# Patient Record
Sex: Male | Born: 2000 | Race: White | Hispanic: No | Marital: Single | Smoking: Never smoker
Health system: Southern US, Community
[De-identification: ages and names within clinical notes are randomized; demographics above are authoritative.]

## PROBLEM LIST (undated history)

## (undated) DIAGNOSIS — F419 Anxiety disorder, unspecified: Secondary | ICD-10-CM

## (undated) DIAGNOSIS — F32A Depression, unspecified: Secondary | ICD-10-CM

## (undated) DIAGNOSIS — F329 Major depressive disorder, single episode, unspecified: Secondary | ICD-10-CM

## (undated) DIAGNOSIS — S42309A Unspecified fracture of shaft of humerus, unspecified arm, initial encounter for closed fracture: Secondary | ICD-10-CM

## (undated) DIAGNOSIS — S6291XA Unspecified fracture of right wrist and hand, initial encounter for closed fracture: Secondary | ICD-10-CM

## (undated) DIAGNOSIS — K219 Gastro-esophageal reflux disease without esophagitis: Secondary | ICD-10-CM

## (undated) DIAGNOSIS — R519 Headache, unspecified: Secondary | ICD-10-CM

## (undated) HISTORY — PX: OTHER SURGICAL HISTORY: SHX169

## (undated) HISTORY — DX: Depression, unspecified: F32.A

## (undated) HISTORY — DX: Anxiety disorder, unspecified: F41.9

---
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
## 2018-11-06 ENCOUNTER — Emergency Department (HOSPITAL_COMMUNITY)
Admission: EM | Admit: 2018-11-06 | Discharge: 2018-11-06 | Disposition: A | Discharge status: Other Acute Inpatient Hospital | Payer: Medicaid Other | Attending: Emergency Medicine | Admitting: Emergency Medicine

## 2018-11-06 ENCOUNTER — Other Ambulatory Visit: Payer: Self-pay

## 2018-11-06 ENCOUNTER — Encounter (HOSPITAL_COMMUNITY): Payer: Self-pay

## 2018-11-06 ENCOUNTER — Inpatient Hospital Stay (HOSPITAL_COMMUNITY)
Admission: AD | Admit: 2018-11-06 | Discharge: 2018-11-11 | DRG: 885 | Disposition: A | Discharge status: Home / Self Care | Payer: Medicaid Other | Source: Intra-hospital | Attending: Psychiatry | Admitting: Psychiatry

## 2018-11-06 ENCOUNTER — Encounter (HOSPITAL_COMMUNITY): Payer: Self-pay | Admitting: *Deleted

## 2018-11-06 DIAGNOSIS — F1210 Cannabis abuse, uncomplicated: Secondary | ICD-10-CM | POA: Diagnosis present

## 2018-11-06 DIAGNOSIS — F419 Anxiety disorder, unspecified: Secondary | ICD-10-CM | POA: Diagnosis present

## 2018-11-06 DIAGNOSIS — G4700 Insomnia, unspecified: Secondary | ICD-10-CM | POA: Diagnosis present

## 2018-11-06 DIAGNOSIS — Z818 Family history of other mental and behavioral disorders: Secondary | ICD-10-CM | POA: Diagnosis not present

## 2018-11-06 DIAGNOSIS — F1290 Cannabis use, unspecified, uncomplicated: Secondary | ICD-10-CM | POA: Insufficient documentation

## 2018-11-06 DIAGNOSIS — Z79899 Other long term (current) drug therapy: Secondary | ICD-10-CM

## 2018-11-06 DIAGNOSIS — R45851 Suicidal ideations: Secondary | ICD-10-CM | POA: Diagnosis present

## 2018-11-06 DIAGNOSIS — F332 Major depressive disorder, recurrent severe without psychotic features: Principal | ICD-10-CM | POA: Diagnosis present

## 2018-11-06 DIAGNOSIS — F329 Major depressive disorder, single episode, unspecified: Secondary | ICD-10-CM | POA: Insufficient documentation

## 2018-11-06 LAB — COMPREHENSIVE METABOLIC PANEL
ALBUMIN: 4.2 g/dL (ref 3.5–5.0)
ALT: 17 U/L (ref 0–44)
AST: 20 U/L (ref 15–41)
Alkaline Phosphatase: 82 U/L (ref 38–126)
Anion gap: 7 (ref 5–15)
BILIRUBIN TOTAL: 1.4 mg/dL — AB (ref 0.3–1.2)
BUN: 7 mg/dL (ref 6–20)
CO2: 24 mmol/L (ref 22–32)
Calcium: 9.4 mg/dL (ref 8.9–10.3)
Chloride: 108 mmol/L (ref 98–111)
Creatinine, Ser: 1.13 mg/dL (ref 0.61–1.24)
GFR calc Af Amer: >60 mL/min (ref >60)
GFR calc non Af Amer: >60 mL/min (ref >60)
Glucose, Bld: 114 mg/dL — ABNORMAL HIGH (ref 70–99)
Potassium: 4 mmol/L (ref 3.5–5.1)
Sodium: 139 mmol/L (ref 135–145)
Total Protein: 6.1 g/dL — ABNORMAL LOW (ref 6.5–8.1)

## 2018-11-06 LAB — CBC
HCT: 42.9 % (ref 39.0–52.0)
HEMOGLOBIN: 14.7 g/dL (ref 13.0–17.0)
MCH: 31.7 pg (ref 26.0–34.0)
MCHC: 34.3 g/dL (ref 30.0–36.0)
MCV: 92.5 fL (ref 80.0–100.0)
Platelets: 199 10*3/uL (ref 150–400)
RBC: 4.64 MIL/uL (ref 4.22–5.81)
RDW: 11.5 % (ref 11.5–15.5)
WBC: 4.2 10*3/uL (ref 4.0–10.5)
nRBC: 0 % (ref 0.0–0.2)

## 2018-11-06 LAB — SALICYLATE LEVEL: Salicylate Lvl: <7 mg/dL (ref 2.8–30.0)

## 2018-11-06 LAB — ACETAMINOPHEN LEVEL

## 2018-11-06 LAB — ETHANOL: Alcohol, Ethyl (B): <10 mg/dL (ref <10)

## 2018-11-06 MED ORDER — MAGNESIUM HYDROXIDE 400 MG/5ML PO SUSP: 30.0000 mL | Freq: Every day | ORAL | Status: DC | PRN

## 2018-11-06 MED ORDER — NICOTINE POLACRILEX 2 MG MT GUM
2.0000 mg | CHEWING_GUM | OROMUCOSAL | Status: DC | PRN
  Administered 2018-11-06 – 2018-11-11 (×12): 2 mg via ORAL
  Filled 2018-11-06 (×5): qty 1

## 2018-11-06 MED ORDER — HYDROXYZINE HCL 25 MG PO TABS
25.0000 mg | ORAL_TABLET | Freq: Three times a day (TID) | ORAL | Status: DC | PRN
  Administered 2018-11-09 – 2018-11-10 (×2): 25 mg via ORAL
  Filled 2018-11-06: qty 1

## 2018-11-06 MED ORDER — TRAZODONE HCL 50 MG PO TABS
50.0000 mg | ORAL_TABLET | Freq: Every evening | ORAL | Status: DC | PRN
  Administered 2018-11-07 – 2018-11-10 (×4): 50 mg via ORAL
  Filled 2018-11-06: qty 1

## 2018-11-06 MED ORDER — ACETAMINOPHEN 325 MG PO TABS
650.0000 mg | ORAL_TABLET | Freq: Four times a day (QID) | ORAL | Status: DC | PRN
  Administered 2018-11-10: 650 mg via ORAL
  Filled 2018-11-06: qty 2

## 2018-11-06 MED ORDER — ALUM & MAG HYDROXIDE-SIMETH 200-200-20 MG/5ML PO SUSP: 30.0000 mL | ORAL | Status: DC | PRN

## 2018-11-06 NOTE — ED Notes (Signed)
Shirt pants wallet and shoes given to pts brother per pts request. No belongings to log or place in locker.

## 2018-11-06 NOTE — Progress Notes (Signed)
Nhia attended wrap-up group. Pt appears anxious in affect and mood. Pt denies SI/HI/AVH/Pain at this time. Pt was given toiletries and oriented to unit. Pt was minimal/guarded with interaction. No new c/o's. Pt was informed of PRNs. Support provided. Will continue with POC.

## 2018-11-06 NOTE — Tx Team (Signed)
Initial Treatment Plan 11/06/2018 5:57 PM [place] B[facility] SUBA [id number]    PATIENT STRESSORS: Financial difficulties Marital or family conflict   PATIENT STRENGTHS: Motivation for treatment/growth Supportive family/friends   PATIENT IDENTIFIED PROBLEMS: "Mental health"  "coping skills and medication"  Anxiety  Depression  Suicidal thoughts             DISCHARGE CRITERIA:  Ability to meet basic life and health needs Adequate post-discharge living arrangements  PRELIMINARY DISCHARGE PLAN: Outpatient therapy Return to previous living arrangement  PATIENT/FAMILY INVOLVEMENT: This treatment plan has been presented to and reviewed with the patient, Cody Kelley, and/or family member.  The patient and family have been given the opportunity to ask questions and make suggestions.  Clarene Critchley, RN 11/06/2018, 5:57 PM

## 2018-11-06 NOTE — ED Provider Notes (Addendum)
[facility] EMERGENCY DEPARTMENT Provider Note   CSN: [id number] Arrival date & time: 11/06/18  1121    History   Chief Complaint Chief Complaint  Patient presents with  . Suicidal    HPI Cody Kelley is a 18 y.o. male.     18 y.o male with a PMH of GERD presents to the ED with a chief complaint of SI x years. Patient reports he has had depressed symptoms for the past few years, states his symptoms have worsening as he has not been in school has no job and currently plays video games all day.  Patient reports he has no plan but states he would kill himself with a gun, however patient does not own a gun.  He denies any HI, hallucinations visual or auditory.  Patient denies any chest pain, shortness of breath, any medical complaint at this time.     Past Medical History:  Diagnosis Date  . Broken arm     There are no active problems to display for this patient.   History reviewed. No pertinent surgical history.      Home Medications    Prior to Admission medications   Medication Sig Start Date End Date Taking? Authorizing Provider  Omeprazole (PRILOSEC PO) Take by mouth.      [provider]    Family History No family history on file.  Social History Social History   Tobacco Use  . Smoking status: Never Smoker  . Smokeless tobacco: Never Used  Substance Use Topics  . Alcohol use: No  . Drug use: Yes    Types: Marijuana    Comment: daily     Allergies   Patient has no known allergies.   Review of Systems Review of Systems  Constitutional: Negative for chills and fever.  HENT: Negative for ear pain and sore throat.   Eyes: Negative for pain and visual disturbance.  Respiratory: Negative for cough and shortness of breath.   Cardiovascular: Negative for chest pain and palpitations.  Gastrointestinal: Negative for abdominal pain and vomiting.  Genitourinary: Negative for dysuria and hematuria.  Musculoskeletal:  Negative for arthralgias and back pain.  Skin: Negative for color change and rash.  Neurological: Negative for seizures and syncope.  Psychiatric/Behavioral: Positive for suicidal ideas. Negative for hallucinations. The patient is not nervous/anxious.   All other systems reviewed and are negative.    Physical Exam Updated Vital Signs BP (!) 142/79 (BP Location: Right Arm)   Pulse 65   Temp 98.6 F (37 C) (Oral)   Resp 20   SpO2 98%   Physical Exam Vitals signs and nursing note reviewed.  Constitutional:      Appearance: He is well-developed.  HENT:     Head: Normocephalic and atraumatic.  Eyes:     General: No scleral icterus.    Pupils: Pupils are equal, round, and reactive to light.  Neck:     Musculoskeletal: Normal range of motion.  Cardiovascular:     Heart sounds: Normal heart sounds.  Pulmonary:     Effort: Pulmonary effort is normal.     Breath sounds: Normal breath sounds. No wheezing.  Chest:     Chest wall: No tenderness.  Abdominal:     General: Bowel sounds are normal. There is no distension.     Palpations: Abdomen is soft.     Tenderness: There is no abdominal tenderness.  Musculoskeletal:        General: No tenderness or deformity.  Skin:    General: Skin is warm and dry.  Neurological:     Mental Status: He is alert and oriented to person, place, and time.      ED Treatments / Results  Labs (all labs ordered are listed, but only abnormal results are displayed) Labs Reviewed  COMPREHENSIVE METABOLIC PANEL - Abnormal; Notable for the following components:      Result Value   Glucose, Bld 114 (*)    Total Protein 6.1 (*)    Total Bilirubin 1.4 (*)    All other components within normal limits  ACETAMINOPHEN LEVEL - Abnormal; Notable for the following components:   Acetaminophen (Tylenol), Serum <10 (*)    All other components within normal limits  ETHANOL  SALICYLATE LEVEL  CBC  RAPID URINE DRUG SCREEN, HOSP PERFORMED     EKG None  Radiology No results found.  Procedures Procedures (including critical care time)  Medications Ordered in ED Medications - No data to display   Initial Impression / Assessment and Plan / ED Course  I have reviewed the triage vital signs and the nursing notes.  Pertinent labs & imaging results that were available during my care of the patient were reviewed by me and considered in my medical decision making (see chart for details).       Patient with no previous medical history aside from reflux presents to the ED with suicidal ideations which she has had for several years.  He reports situation has worsening as he is currently not employed, not in school and plays video games all day.  He reports feeling worse about himself that he spends all day at home.  He does not have a definite plan but reports if he were to hurt himself it would be with a gun, patient denies owning a gun at this time.  He has not tried any outpatient resources at this time.  Denies any HI, visual or auditory hallucinations.Denies any chest pain, shortness of breath or medical complaint at this time. CMP showed no electrolyte abnormality, LFTs are within normal limits.  CBC showed no leukocytosis, hemoglobin stable.  Salicylate and ethanol levels are reassuring.  UDS is pending.  At this time patient is medically clear for psychiatry evaluation.   1:59 PM spoke to TTS, Cody Kelley currently meets criteria for psychiatric inpatient pending placement.  Final Clinical Impressions(s) / ED Diagnoses   Final diagnoses:  Suicidal ideation    ED Discharge Orders    None       Claude Manges, PA-C 11/06/18 1400    Claude Manges, PA-C 11/06/18 1419    Linwood Dibbles, MD 11/07/18 1133

## 2018-11-06 NOTE — Progress Notes (Signed)
Admission Note: Patient is a 18 year old male admitted to the unit for symptoms of anxiety, depression and suicidal ideation with no plan.  Patient is alert and oriented x 4.  Presents with anxious affect and mood.  States he's here to get help for his racing thoughts and depression.  Admission plan of care reviewed and consent signed.  Skin assessment completed. Skin is dry and intact.  No contraband found.  Patient oriented to the unit, staff and room.  Routine safety checks initiated.  Verbalizes understanding of unit rules and protocol.  Patient is safe on the unit.

## 2018-11-06 NOTE — Progress Notes (Signed)
Per Berneice Heinrich, [facility], pt has been accepted to [facility] bed 406-1. Accepting provider is Reola Calkins, NP. Attending provider is Dr. Jola Babinski, MD. Patient can arrive anytime. Number for report is [phone]. CSW spoke with Neldon Labella, RN regarding disposition.   Vilma Meckel. Algis Greenhouse, MSW, LCSW Clinical Social Work/Disposition Phone: [phone] Fax: [phone]

## 2018-11-06 NOTE — Progress Notes (Signed)
Pt actively participated in wrap-up group this evening.

## 2018-11-06 NOTE — ED Triage Notes (Signed)
Pt reports having suicidal though for "several years". Pt states that recently he has been at home a lot and doesn't feel like he is any use for his family. Pt states that he has a plan but when asked he states I just want to leave this earth. Pt states that he has access to knives. pts brother is at bedside and reports that their parents have been in jail most of their lives and their grandmother cares for them and is getting old. pts mother was recently released from jail and "has nothing to do with him". Pt reported to play video games a lot. pts brother is concerned that he has had bruising to his face but is unsure of how he received them. Pt calm and cooperative upon arrival.

## 2018-11-06 NOTE — BH Assessment (Addendum)
Assessment Note  Cody Kelley is an 18 y.o. male who was brought to [facility] by his maternal Grand Mother.  Patient reports experiencing SI and depressive symptoms for the past 2 years.  Mood "depressed and anxious", affect congruent with mood.  Patient reports current SI with a plan to shot self with a gun.  Patient acknowledged he does not have access to a gun.  Patient denied HI, AVH, and self harming.  Patient reports becoming depressed 2 years ago after Mother became dependent on drugs and Father was incarcerated.  Patient reports he and 4 siblings went to live with maternal Grand Mother.  He also reports dropping out of high school starting 12th grade year and sitting home and playing video games all day.  Patient reports smoking 1 to 1 1/2 grams of Cannabis daily.  He reports Cannabis use helps him calm down when he is angry.  Patient reports experiencing racing thoughts and periodic high anxiety.  He reports having a poor appetite and plays video games all night and sleep during the day.  Patient denied a history of outpatient or inpatient mental health or substance use treatment.  Patient reports wanting help for depression and Cannabis use.   Per Reola Calkins, NP; Patient meets inpatient criteria and placement will be sought.  Patient's disposition provided to [employer]..         Diagnosis: Major Depressive Disorder, recurrent, severe with out psychosis; Cannabis Use Disorder, moderate  Past Medical History:  Past Medical History:  Diagnosis Date  . Broken arm     History reviewed. No pertinent surgical history.  Family History: No family history on file.  Social History:  reports that he has never smoked. He has never used smokeless tobacco. He reports current drug use. Drug: Marijuana. He reports that he does not drink alcohol.  Additional Social History:  Substance #2 Name of Substance 2: Cannabis 2 - Age of First Use: 18 years of age 23 - Amount (size/oz): 1 to 1/2 grams per  use 2 - Frequency: Daily 2 - Duration: Ongoing 2 - Last Use / Amount: 11/06/2018  CIWA: CIWA-Ar BP: (!) 142/79 Pulse Rate: 65 COWS:    Allergies: No Known Allergies  Home Medications: (Not in a hospital admission)   OB/GYN Status:  No LMP for male patient.  General Assessment Data Location of Assessment: [facility] ED TTS Assessment: In system Is this a Tele or Face-to-Face Assessment?: Tele Assessment Is this an Initial Assessment or a Re-assessment for this encounter?: Initial Assessment Patient Accompanied by:: N/A Language Other than English: No What gender do you identify as?: Male Marital status: Single Living Arrangements: Other relatives(Materanl Grand Mother and younger Brother ) Can pt return to current living arrangement?: Yes Admission Status: Voluntary Is patient capable of signing voluntary admission?: Yes Referral Source: Self/Family/Friend Insurance type: Medicaid  Medical Screening Exam [facility] Walk-in ONLY) Medical Exam completed: Yes  Crisis Care Plan Living Arrangements: Other relatives(Materanl Grand Mother and younger Brother ) Name of Psychiatrist: None Name of Therapist: None  Education Status Is patient currently in school?: No  Risk to self with the past 6 months Suicidal Ideation: Yes-Currently Present Has patient been a risk to self within the past 6 months prior to admission? : Yes Suicidal Intent: Yes-Currently Present Has patient had any suicidal intent within the past 6 months prior to admission? : No Is patient at risk for suicide?: Yes Suicidal Plan?: Yes-Currently Present Has patient had any suicidal plan within the past  6 months prior to admission? : No Specify Current Suicidal Plan: Shot self with gun Access to Means: No(Patient denied access to a gun ) What has been your use of drugs/alcohol within the last 12 months?: Cannabis Previous Attempts/Gestures: No How many times?: 0 Triggers for Past Attempts: None known Intentional Self  Injurious Behavior: None Family Suicide History: Unknown Recent stressful life event(s): Other (Comment)(Unemployed and no future plans) Persecutory voices/beliefs?: No Depression: Yes Depression Symptoms: Isolating, Feeling worthless/self pity, Feeling angry/irritable Substance abuse history and/or treatment for substance abuse?: Yes Suicide prevention information given to non-admitted patients: Not applicable  Risk to Others within the past 6 months Homicidal Ideation: No Does patient have any lifetime risk of violence toward others beyond the six months prior to admission? : No Thoughts of Harm to Others: No Current Homicidal Intent: No Current Homicidal Plan: No Access to Homicidal Means: No History of harm to others?: No Assessment of Violence: None Noted Does patient have access to weapons?: No(Patient denied ) Criminal Charges Pending?: No Does patient have a court date: No Is patient on probation?: No  Psychosis Hallucinations: None noted Delusions: None noted  Mental Status Report Appearance/Hygiene: In scrubs Eye Contact: Fair Motor Activity: Restlessness Speech: Logical/coherent Level of Consciousness: Alert Mood: Depressed, Anxious Affect: Anxious, Depressed Anxiety Level: Moderate Thought Processes: Coherent, Relevant Judgement: Impaired Orientation: Person, Place, Time, Situation Obsessive Compulsive Thoughts/Behaviors: None  Cognitive Functioning Concentration: Good Memory: Recent Intact, Remote Intact Is patient IDD: No Insight: Poor Impulse Control: Poor Appetite: Poor Have you had any weight changes? : No Change Sleep: Decreased Total Hours of Sleep: 8(during the day, stays up all night) Vegetative Symptoms: None  ADLScreening [facility] Assessment Services) Patient's cognitive ability adequate to safely complete daily activities?: Yes Patient able to express need for assistance with ADLs?: Yes Independently performs ADLs?: Yes (appropriate for  developmental age)  Prior Inpatient Therapy Prior Inpatient Therapy: No  Prior Outpatient Therapy Prior Outpatient Therapy: No Does patient have an ACCT team?: No Does patient have Intensive In-House Services?  : No Does patient have [facility] services? : No Does patient have P4CC services?: No  ADL Screening (condition at time of admission) Patient's cognitive ability adequate to safely complete daily activities?: Yes Is the patient deaf or have difficulty hearing?: No Does the patient have difficulty concentrating, remembering, or making decisions?: No Patient able to express need for assistance with ADLs?: Yes Does the patient have difficulty dressing or bathing?: No Independently performs ADLs?: Yes (appropriate for developmental age) Does the patient have difficulty walking or climbing stairs?: No Weakness of Legs: None Weakness of Arms/Hands: None  Home Assistive Devices/Equipment Home Assistive Devices/Equipment: None      Values / Beliefs Cultural Requests During Hospitalization: None Spiritual Requests During Hospitalization: None   Advance Directives (For Healthcare) Does Patient Have a Medical Advance Directive?: No Would patient like information on creating a medical advance directive?: No - Patient declined          Disposition:  Disposition Initial Assessment Completed for this Encounter: Yes  On Site Evaluation by:   Reviewed with Physician:    Dey-Johnson,Nyelle Wolfson 11/06/2018 1:50 PM

## 2018-11-06 NOTE — ED Notes (Signed)
Pt wanded by security. 

## 2018-11-07 LAB — RAPID URINE DRUG SCREEN, HOSP PERFORMED
Amphetamines: NOT DETECTED
Barbiturates: NOT DETECTED
Benzodiazepines: NOT DETECTED
Cocaine: NOT DETECTED
Opiates: NOT DETECTED
Tetrahydrocannabinol: POSITIVE — AB

## 2018-11-07 MED ORDER — SERTRALINE HCL 25 MG PO TABS
25.0000 mg | ORAL_TABLET | Freq: Every day | ORAL | Status: DC
  Administered 2018-11-07 – 2018-11-08 (×2): 25 mg via ORAL
  Filled 2018-11-07 (×5): qty 1

## 2018-11-07 NOTE — [facility] Group Notes (Signed)
[facility] Group Notes:  (Nursing)  Date:  11/07/2018  Time:  1:30 PM Type of Therapy:  Nurse Education  Participation Level:  Did Not Attend  [name] 11/07/2018, 3:01 PM
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
## 2018-11-07 NOTE — Progress Notes (Signed)
D. Pt presents with an anxious affect/mood- brightens somewhat during interactions. Pt observed interacting appropriately with others in the milieu. Per pt's self inventory, pt rates his depression, hopelessness and anxiety a 4/4/8, respectively. Pt writes that his most important goal today is "work on being positive and smile more" and writes "I will try to communicate with others" to help him meet that goal.  Pt currently denies SI/HI and AV hallucinations A. Labs and vitals monitored. Pt given and educated on medication. Pt supported emotionally and encouraged to express concerns and ask questions.   R. Pt remains safe with 15 minute checks. Will continue POC.

## 2018-11-07 NOTE — Plan of Care (Signed)
D: Patient grandmother approached this RN to ask about new medications. Patient visited with his grandmother and one other visitor in the dayroom and stayed in the dayroom after they left. Patient is alert, oriented, pleasant, and cooperative. Denies SI, HI, AVH, and verbally contracts for safety. Mood and affect are anxious, patient is fidgety. Patient denies physical symptoms/pain.    A: Medications administered per MD order. Support provided. Patient educated on safety on the unit and medications. Routine safety checks every 15 minutes. Patient stated understanding to tell nurse about any new physical symptoms. Patient understands to tell staff of any needs.     R: No adverse drug reactions noted. Patient verbally contracts for safety. Patient remains safe at this time and will continue to monitor.   Problem: Education: Goal: Knowledge of [facility] Education information/materials will improve Outcome: Progressing   Problem: Medication: Goal: Compliance with prescribed medication regimen will improve Outcome: Progressing   Patient oriented to the unit. Patient is willing to take medications as prescribed. Patient remains safe and will continue to monitor.

## 2018-11-07 NOTE — [facility] Suicide Risk Assessment (Signed)
[facility] Admission Suicide Risk Assessment   Nursing information obtained from:  Patient Demographic factors:  Male Current Mental Status:  Self-harm thoughts Loss Factors:  Financial problems / change in socioeconomic status Historical Factors:  NA Risk Reduction Factors:  Living with another person, especially a relative  Total Time spent with patient: 30 minutes Principal Problem: <principal problem not specified> Diagnosis:  Active Problems:   MDD (major depressive disorder), recurrent severe, without psychosis (HCC)  Subjective Data: Patient is seen and examined.  Patient is a 18 year old male with a past psychiatric history significant for cannabis use disorder who presented to the [facility] emergency department on 11/06/2018 with suicidal ideation.  He was brought to the emergency department by his grandmother.  The patient stated that he had been having suicidal ideation for the last 2 years.  He stated he had felt depressed.  He stated he had a plan to shoot himself with a gun.  The patient and grandmother in the emergency department stated that he had no access to weapons.  The patient stated that he had been depressed since his mother became dependent on drugs and his father was incarcerated.  He apparently has been living with the grandmother since he was in fifth grade.  He has not been seen by a psychiatrist before.  He did receive counseling with something related to issues with his mother and father as an adolescent.  Patient stated that he does not work, and plays video games all day while smoking marijuana.  He did admit to racing thoughts and some high anxiety.  He smiled and engaged throughout the interview.  He denied any previous self-harm in the past.  He admitted to helplessness, hopelessness and worthlessness.  He was admitted to the hospital for evaluation and stabilization.  Continued Clinical Symptoms:  Alcohol Use Disorder Identification Test Final Score  (AUDIT): 0 The "Alcohol Use Disorders Identification Test", Guidelines for Use in Primary Care, Second Edition.  [employer] writer [facility]). Score between 0-7:  no or low risk or alcohol related problems. Score between 8-15:  moderate risk of alcohol related problems. Score between 16-19:  high risk of alcohol related problems. Score 20 or above:  warrants further diagnostic evaluation for alcohol dependence and treatment.   CLINICAL FACTORS:   Depression:   Anhedonia Comorbid alcohol abuse/dependence Hopelessness Impulsivity Insomnia Alcohol/Substance Abuse/Dependencies   Musculoskeletal: Strength & Muscle Tone: within normal limits Gait & Station: normal Patient leans: N/A  Psychiatric Specialty Exam: Physical Exam  Nursing note and vitals reviewed. Constitutional: He is oriented to person, place, and time. He appears well-developed and well-nourished.  HENT:  Head: Normocephalic and atraumatic.  Respiratory: Effort normal.  Neurological: He is alert and oriented to person, place, and time.    ROS  Blood pressure 135/65, pulse 86, temperature 98.9 F (37.2 C), temperature source Oral, resp. rate 18, height 5\' 2"  (1.575 m), weight 56.2 kg, SpO2 98 %.Body mass index is 22.68 kg/m.  General Appearance: Casual  Eye Contact:  Fair  Speech:  Normal Rate  Volume:  Normal  Mood:  Anxious and Depressed  Affect:  Congruent  Thought Process:  Coherent and Descriptions of Associations: Circumstantial  Orientation:  Full (Time, Place, and Person)  Thought Content:  Logical  Suicidal Thoughts:  Yes.  without intent/plan  Homicidal Thoughts:  No  Memory:  Immediate;   Fair Recent;   Fair Remote;   Fair  Judgement:  Impaired  Insight:  Lacking  Psychomotor Activity:  Decreased  Concentration:  Concentration: Fair and Attention Span: Fair  Recall:  [employer] of Knowledge:  Fair  Language:  Fair  Akathisia:  Negative  Handed:  Right  AIMS (if indicated):     Assets:   Desire for Improvement Housing Leisure Time Physical Health Social Support  ADL's:  Intact  Cognition:  WNL  Sleep:  Number of Hours: 5.75      COGNITIVE FEATURES THAT CONTRIBUTE TO RISK:  None    SUICIDE RISK:   Minimal: No identifiable suicidal ideation.  Patients presenting with no risk factors but with morbid ruminations; may be classified as minimal risk based on the severity of the depressive symptoms  PLAN OF CARE: Patient is seen and examined.  Patient is a 18 year old male with the above-stated past psychiatric history who presented to the [facility] behavioral [facility] hospital with suicidal ideation and depression as well as anxiety.  He will be admitted to the hospital.  He will be integrated into the milieu.  He will be encouraged to attend groups.  He will be placed on Zoloft 25 mg p.o. daily.  He will have hydroxyzine available for anxiety, and as well trazodone for sleep.  We will collect collateral information from his grandmother.  Drug screen has not yet been collected, and I will order that this morning.  The rest of his laboratories were reviewed, and are essentially normal.  His blood alcohol was less than 10, salicylate less than 7, acetaminophen less than 10.  I certify that inpatient services furnished can reasonably be expected to improve the patient's condition.   Antonieta Pert, MD 11/07/2018, 8:47 AM

## 2018-11-07 NOTE — Progress Notes (Signed)
Adult Psychoeducational Group Note  Date:  11/07/2018 Time:  8:55 PM  Group Topic/Focus:  Wrap-Up Group:   The focus of this group is to help patients review their daily goal of treatment and discuss progress on daily workbooks.  Participation Level:  Active  Participation Quality:  Appropriate  Affect:  Appropriate  Cognitive:  Alert  Insight: Appropriate  Engagement in Group:  Engaged  Modes of Intervention:  Discussion  Additional Comments:  Patient stated having a pretty good day. Patient's goal for today was to find out what medications he's on. Patient met goal.   Shi Blankenship L Osualdo Hansell 11/07/2018, 8:55 PM

## 2018-11-07 NOTE — [facility] Group Notes (Signed)
[facility] LCSW Group Therapy Note  11/07/2018   10:00-11:00AM  Type of Therapy and Topic:  Group Therapy:  Unhealthy versus Healthy Supports, Which Am I?  Participation Level:  Active   Description of Group:  Patients in this group were introduced to the concept that additional supports including self-support are an essential part of recovery.  Initially a discussion was held about the differences between healthy versus unhealthy supports.  Patients were asked to share what unhealthy supports in their lives need to be addressed, as well as what additional healthy supports could be added for greater help in reaching their goals.   A song entitled "My Own Hero" was played and a group discussion ensued in which patients stated they could relate to the song and it inspired them to realize they have be willing to help themselves in order to succeed, because other people cannot achieve sobriety or stability for them.  We discussed adding a variety of healthy supports to address the various needs in patient lives, including becoming more self-supportive.  Therapeutic Goals: 1)  Highlight the differences between healthy and unhealthy supports 2)  Suggest the importance of being a part of one's own support system 2)  Discuss reasons people in one's life may eventually be unable to be continually supportive  3)  Identify the patient's current support system and   4)  elicit commitments to add healthy supports and to become more conscious of being self-supportive   Summary of Patient Progress:  The patient expressed that the unhealthy support which needs to be addressed includes "doubting myself, because I'm the only person who can guide myself."  Healthy supports which could be added for increased stability and happiness include a therapist, while he already has some level of family and friend support. He was attentive but quiet throughout group.  Therapeutic Modalities:   Motivational  Interviewing Activity  Lynnell Chad

## 2018-11-08 DIAGNOSIS — F332 Major depressive disorder, recurrent severe without psychotic features: Principal | ICD-10-CM

## 2018-11-08 MED ORDER — SERTRALINE HCL 50 MG PO TABS
50.0000 mg | ORAL_TABLET | Freq: Every day | ORAL | Status: DC
  Administered 2018-11-09 – 2018-11-11 (×3): 50 mg via ORAL
  Filled 2018-11-08 (×5): qty 1

## 2018-11-08 NOTE — Progress Notes (Signed)
[facility] MD Progress Note  11/08/2018 3:32 PM Cody Kelley  MRN:  [id number] Subjective: " I am feeling good". Describes improving mood and currently denies any suicidal or self injurious ideations. Denies medication side effects.  Objective: I have discussed case with treatment team and have met with patient. 18 year old male, lives with grandmother,whom he identifies as his closest support. He presented to hospital voluntarily for depression, suicidal ideations, thoughts of shooting himself with a gun. Stressors include not being at school ( which he attributes to anxiety) and states " I have just been playing video games all day". States " I feel kind of stuck". Other stressors include  mother having substance abuse disorder and father being incarcerated.  He describes history of chronic anxiety in addition to depression, and describes symptoms suggestive of social anxiety, with significant anxiety when having to speak in front of others, fearful of being judged negatively. Patient has a history of cannabis abuse-admission UDS was positive for cannabis , admission BAL was negative .  No prior psychiatric admissions. Denies prior history of suicide attempts.   Today patient reports partial improvement compared to admission, states " I am definitely better". Denies suicidal ideations or self injurious ideations. States he has been able to spend time in day room and interact with peers, states " once I get to know people, I feel calmer". Denies medication side effects.  No disruptive behaviors on unit .  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 Broken arm    History reviewed. No  pertinent surgical history. Family History: History reviewed. No pertinent family history. Family Psychiatric  History:  Social History:  Social History   Substance and Sexual Activity  Alcohol Use No     Social History   Substance and Sexual Activity  Drug Use Yes  . Types: Marijuana   Comment: daily    Social History   Socioeconomic History  . Marital status: Single    Spouse name: Not on file  . Number of children: Not on file  . Years of education: Not on file  . Highest education level: Not on file  Occupational History  . Not on file  Social Needs  . Financial resource strain: Not on file  . Food insecurity:    Worry: Not on file    Inability: Not on file  . Transportation needs:    Medical: Not on file    Non-medical: Not on file  Tobacco Use  . Smoking status: Never Smoker  . Smokeless tobacco: Never Used  Substance and Sexual Activity  . Alcohol use: No  . Drug use: Yes    Types: Marijuana    Comment: daily  . Sexual activity: Not on file  Lifestyle  . Physical activity:    Days per week: Not on file    Minutes per session: Not on file  . Stress: Not on file  Relationships  . Social connections:    Talks on phone: Not on file    Gets together: Not on file    Attends religious service: Not on file    Active member of club or organization: Not on file    Attends meetings of clubs or organizations: Not on file  Relationship status: Not on file  Other Topics Concern  . Not on file  Social History Narrative  . Not on file   Additional Social History:   Sleep: improved   Appetite:  Fair  Current Medications: Current Facility-Administered Medications  Medication Dose Route Frequency Provider Last Rate Last Dose  . acetaminophen (TYLENOL) tablet 650 mg  650 mg Oral Q6H PRN Money, Lowry Ram, FNP      . alum & mag hydroxide-simeth (MAALOX/MYLANTA) 200-200-20 MG/5ML suspension 30 mL  30 mL Oral Q4H PRN Money, Lowry Ram, FNP      . hydrOXYzine  (ATARAX/VISTARIL) tablet 25 mg  25 mg Oral TID PRN Money, Lowry Ram, FNP      . magnesium hydroxide (MILK OF MAGNESIA) suspension 30 mL  30 mL Oral Daily PRN Money, Darnelle Maffucci B, FNP      . nicotine polacrilex (NICORETTE) gum 2 mg  2 mg Oral PRN Sharma Covert, MD   2 mg at 11/08/18 1422  . sertraline (ZOLOFT) tablet 25 mg  25 mg Oral Daily Sharma Covert, MD   25 mg at 11/08/18 0818  . traZODone (DESYREL) tablet 50 mg  50 mg Oral QHS PRN Money, Lowry Ram, FNP   50 mg at 11/07/18 2200    Lab Results:  Results for orders placed or performed during the hospital encounter of 11/06/18 (from the past 48 hour(s))  Rapid urine drug screen (hospital performed)     Status: Abnormal   Collection Time: 11/07/18  8:10 AM  Result Value Ref Range   Opiates NONE DETECTED NONE DETECTED   Cocaine NONE DETECTED NONE DETECTED   Benzodiazepines NONE DETECTED NONE DETECTED   Amphetamines NONE DETECTED NONE DETECTED   Tetrahydrocannabinol POSITIVE (A) NONE DETECTED   Barbiturates NONE DETECTED NONE DETECTED    Comment: (NOTE) DRUG SCREEN FOR MEDICAL PURPOSES ONLY.  IF CONFIRMATION IS NEEDED FOR ANY PURPOSE, NOTIFY LAB WITHIN 5 DAYS. LOWEST DETECTABLE LIMITS FOR URINE DRUG SCREEN Drug Class                     Cutoff (ng/mL) Amphetamine and metabolites    1000 Barbiturate and metabolites    200 Benzodiazepine                 614 Tricyclics and metabolites     300 Opiates and metabolites        300 Cocaine and metabolites        300 THC                            50 Performed at [facility], [place] [address] [place], [place] [address]     Blood Alcohol level:  Lab Results  Component Value Date   ETH <10 00/86/7619    Metabolic Disorder Labs: No results found for: HGBA1C, MPG No results found for: PROLACTIN No results found for: CHOL, TRIG, HDL, CHOLHDL, VLDL, LDLCALC  Physical Findings: AIMS: Facial and Oral Movements Muscles of Facial Expression: None, normal Lips  and Perioral Area: None, normal Jaw: None, normal Tongue: None, normal,Extremity Movements Upper (arms, wrists, hands, fingers): None, normal Lower (legs, knees, ankles, toes): None, normal, Trunk Movements Neck, shoulders, hips: None, normal, Overall Severity Severity of abnormal movements (highest score from questions above): None, normal Incapacitation due to abnormal movements: None, normal Patient's awareness of abnormal movements (rate only patient's report): No Awareness, Dental Status Current problems with teeth and/or  dentures?: No Does patient usually wear dentures?: No  CIWA:    COWS:     Musculoskeletal: Strength & Muscle Tone: within normal limits Gait & Station: normal Patient leans: N/A  Psychiatric Specialty Exam: Physical Exam  ROS no headache, no chest pain, no shortness of breath, no vomiting, no fever or chills   Blood pressure 120/75, pulse (!) 52, temperature 98.9 F (37.2 C), temperature source Oral, resp. rate 16, height _0  (1.575 m), weight 56.2 kg, SpO2 98 %.Body mass index is 22.68 kg/m.  General Appearance: Fairly Groomed  Eye Contact:  Fair  Speech:  Normal Rate  Volume:  Normal  Mood:  partially improved, describes mood as 7/10 with 10 being best   Affect:  anxious, vaguely depressed, does smile at times appropriately   Thought Process:  Linear and Descriptions of Associations: Intact  Orientation:  Full (Time, Place, and Person)  Thought Content:  denies hallucinations, no delusions   Suicidal Thoughts:  No denies suicidal or self injurious ideations, denies homicidal or violent ideations   Homicidal Thoughts:  No  Memory:  recent and remote grossly intact   Judgement:  Fair  Insight:  Fair  Psychomotor Activity:  Normal  Concentration:  Concentration: Good and Attention Span: Good  Recall:  Good  Fund of Knowledge:  Good  Language:  Good  Akathisia:  Negative  Handed:  Right  AIMS (if indicated):     Assets:  Desire for  Improvement Resilience  ADL's:  Intact  Cognition:  WNL  Sleep:  Number of Hours: 6.5   Assessment-  18 year old male, lives with grandmother,whom he identifies as his closest support. He presented to hospital voluntarily for depression, suicidal ideations, thoughts of shooting himself with a gun. Stressors include not being at school ( which he attributes to anxiety) and states " I have just been playing video games all day". States " I feel kind of stuck". Other stressors include  mother having substance abuse disorder and father being incarcerated.  He describes history of chronic anxiety in addition to depression, and describes symptoms suggestive of social anxiety, with significant anxiety when having to speak in front of others, fearful of being judged negatively. Patient has a history of cannabis abuse-admission UDS was positive for cannabis , admission BAL was negative .  No prior psychiatric admissions. Denies prior history of suicide attempts.   Currently patient reports feeling partially better than he did on admission. Currently denies suicidal or self injurious ideations, and today presents future oriented, stating he plans to study for/ obtain GED after discharge. Thus far tolerating Zoloft well , denies side effects .   Treatment Plan Summary: Daily contact with patient to assess and evaluate symptoms and progress in treatment, Medication management, Plan inpatient treatment and medications as below Encourage group and milieu participation to work on coping skills and symptom reduction Encourage efforts to work on sobriety/abstinence Treatment team working on disposition planning options Increase Zoloft to 50 mgrs QDAY for depression, anxiety Continue Trazodone 50 mgrs QHS PRN for insomnia Continue Vistaril 25 mgrs TID PRN for anxiety  Jenne Campus, MD 11/08/2018, 3:32 PM

## 2018-11-08 NOTE — Tx Team (Signed)
Interdisciplinary Treatment and Diagnostic Plan Update  11/08/2018 Time of Session: 10:00am Cody Kelley MRN: [id number]  Principal Diagnosis: <principal problem not specified>  Secondary Diagnoses: Active Problems:   MDD (major depressive disorder), recurrent severe, without psychosis (HCC)   Current Medications:  Current Facility-Administered Medications  Medication Dose Route Frequency Provider Last Rate Last Dose  . acetaminophen (TYLENOL) tablet 650 mg  650 mg Oral Q6H PRN Money, Gerlene Burdock, FNP      . alum & mag hydroxide-simeth (MAALOX/MYLANTA) 200-200-20 MG/5ML suspension 30 mL  30 mL Oral Q4H PRN Money, Gerlene Burdock, FNP      . hydrOXYzine (ATARAX/VISTARIL) tablet 25 mg  25 mg Oral TID PRN Money, Gerlene Burdock, FNP      . magnesium hydroxide (MILK OF MAGNESIA) suspension 30 mL  30 mL Oral Daily PRN Money, Feliz Beam B, FNP      . nicotine polacrilex (NICORETTE) gum 2 mg  2 mg Oral PRN Antonieta Pert, MD   2 mg at 11/08/18 1422  . sertraline (ZOLOFT) tablet 25 mg  25 mg Oral Daily Antonieta Pert, MD   25 mg at 11/08/18 0818  . traZODone (DESYREL) tablet 50 mg  50 mg Oral QHS PRN Money, Gerlene Burdock, FNP   50 mg at 11/07/18 2200   PTA Medications: No medications prior to admission.    Patient Stressors: Financial difficulties Marital or family conflict  Patient Strengths: Motivation for treatment/growth Supportive family/friends  Treatment Modalities: Medication Management, Group therapy, Case management,  1 to 1 session with clinician, Psychoeducation, Recreational therapy.   Physician Treatment Plan for Primary Diagnosis: <principal problem not specified> Long Term Goal(s): Improvement in symptoms so as ready for discharge Improvement in symptoms so as ready for discharge   Short Term Goals: Ability to identify changes in lifestyle to reduce recurrence of condition will improve Ability to verbalize feelings will improve Ability to disclose and discuss suicidal  ideas Ability to demonstrate self-control will improve Ability to identify and develop effective coping behaviors will improve Ability to maintain clinical measurements within normal limits will improve Ability to identify triggers associated with substance abuse/mental health issues will improve Ability to identify changes in lifestyle to reduce recurrence of condition will improve Ability to verbalize feelings will improve Ability to disclose and discuss suicidal ideas Ability to demonstrate self-control will improve Ability to identify and develop effective coping behaviors will improve Ability to maintain clinical measurements within normal limits will improve Ability to identify triggers associated with substance abuse/mental health issues will improve  Medication Management: Evaluate patient's response, side effects, and tolerance of medication regimen.  Therapeutic Interventions: 1 to 1 sessions, Unit Group sessions and Medication administration.  Evaluation of Outcomes: Progressing  Physician Treatment Plan for Secondary Diagnosis: Active Problems:   MDD (major depressive disorder), recurrent severe, without psychosis (HCC)  Long Term Goal(s): Improvement in symptoms so as ready for discharge Improvement in symptoms so as ready for discharge   Short Term Goals: Ability to identify changes in lifestyle to reduce recurrence of condition will improve Ability to verbalize feelings will improve Ability to disclose and discuss suicidal ideas Ability to demonstrate self-control will improve Ability to identify and develop effective coping behaviors will improve Ability to maintain clinical measurements within normal limits will improve Ability to identify triggers associated with substance abuse/mental health issues will improve Ability to identify changes in lifestyle to reduce recurrence of condition will improve Ability to verbalize feelings will improve Ability to disclose and  discuss suicidal  ideas Ability to demonstrate self-control will improve Ability to identify and develop effective coping behaviors will improve Ability to maintain clinical measurements within normal limits will improve Ability to identify triggers associated with substance abuse/mental health issues will improve     Medication Management: Evaluate patient's response, side effects, and tolerance of medication regimen.  Therapeutic Interventions: 1 to 1 sessions, Unit Group sessions and Medication administration.  Evaluation of Outcomes: Progressing   RN Treatment Plan for Primary Diagnosis: <principal problem not specified> Long Term Goal(s): Knowledge of disease and therapeutic regimen to maintain health will improve  Short Term Goals: Ability to participate in decision making will improve, Ability to verbalize feelings will improve, Ability to identify and develop effective coping behaviors will improve and Compliance with prescribed medications will improve  Medication Management: RN will administer medications as ordered by provider, will assess and evaluate patient's response and provide education to patient for prescribed medication. RN will report any adverse and/or side effects to prescribing provider.  Therapeutic Interventions: 1 on 1 counseling sessions, Psychoeducation, Medication administration, Evaluate responses to treatment, Monitor vital signs and CBGs as ordered, Perform/monitor CIWA, COWS, AIMS and Fall Risk screenings as ordered, Perform wound care treatments as ordered.  Evaluation of Outcomes: Progressing   LCSW Treatment Plan for Primary Diagnosis: <principal problem not specified> Long Term Goal(s): Safe transition to appropriate next level of care at discharge, Engage patient in therapeutic group addressing interpersonal concerns.  Short Term Goals: Engage patient in aftercare planning with referrals and resources, Increase social support, Increase emotional  regulation and Increase skills for wellness and recovery  Therapeutic Interventions: Assess for all discharge needs, 1 to 1 time with Social worker, Explore available resources and support systems, Assess for adequacy in community support network, Educate family and significant other(s) on suicide prevention, Complete Psychosocial Assessment, Interpersonal group therapy.  Evaluation of Outcomes: Progressing   Progress in Treatment: Attending groups: Yes. Participating in groups: Yes. Taking medication as prescribed: Yes. Toleration medication: Yes. Family/Significant other contact made: No, will contact:  grandmother Patient understands diagnosis: Yes. Discussing patient identified problems/goals with staff: Yes. Medical problems stabilized or resolved: Yes. Denies suicidal/homicidal ideation: Yes. Issues/concerns per patient self-inventory: Yes.  New problem(s) identified: No, Describe:  CSW continuing to assess  New Short Term/Long Term Goal(s): medication management for mood stabilization; elimination of SI thoughts; development of comprehensive mental wellness/sobriety plan.  Patient Goals:  Learn coping skills and manage anxiety  Discharge Plan or Barriers: CSW assessing for appropriate referrals.   Reason for Continuation of Hospitalization: Anxiety Depression  Estimated Length of Stay: 3-5 days  Attendees: Patient: 11/08/2018 2:42 PM  Physician:  11/08/2018 2:42 PM  Nursing:  11/08/2018 2:42 PM  RN Care Manager: 11/08/2018 2:42 PM  Social Worker: Enid Cutter, LCSWA 11/08/2018 2:42 PM  Recreational Therapist:  11/08/2018 2:42 PM  Other:  11/08/2018 2:42 PM  Other:  11/08/2018 2:42 PM  Other: 11/08/2018 2:42 PM    Scribe for Treatment Team: Darreld Mclean, LCSWA 11/08/2018 2:42 PM

## 2018-11-08 NOTE — Progress Notes (Signed)
Patient rated his day an 8 because he woke up with a smile on his face. Patient's goal for today was to make eye contact with everyone.

## 2018-11-08 NOTE — Plan of Care (Signed)
  Problem: Coping: Goal: Ability to identify and develop effective coping behavior will improve Outcome: Progressing   D: Pt alert and oriented on the unit. Pt engaging with RN staff and other pts. Pt denies SI/HI, A/VH, and any pain. Pt also participated during unit groups and activities. Pt is pleasant and cooperative. Pt rated his depression and feelings of hopelessness both a 0, and his anxiety a 2. Pt's goal for today is "communicate more with people."   A: Education, support and encouragement provided, q15 minute safety checks remain in effect. Medications administered per MD orders.  R: No reactions/side effects to medicine noted. Pt denies any concerns at this time, and verbally contracts for safety. Pt ambulating on the unit with no issues. Pt remains safe on and off the unit.

## 2018-11-09 NOTE — Progress Notes (Signed)
D:  Marcial was pleasant and cooperative on initial approach.  He was smiling and reported that he had a good day.  He attended evening wrap up group.  He denied SI/HI or A/V hallucinations.  He denied any pain or discomfort and appeared to be in no physical distress.  He requested trazodone for sleep this evening.  He is currently resting with his eyes closed and appears to be in no physical distress. A:  1:1 with RN for support and encouragement.  Medications as ordered.  Q 15 minute checks maintained for safety.  Encouraged participation in group and unit activities.   R:  Jim remains safe on the unit.  We will continue to monitor the progress towards his goals.

## 2018-11-09 NOTE — Progress Notes (Signed)
[facility] MD Progress Note  11/09/2018 3:03 PM Cody Kelley  MRN:  [id number] Subjective:  Patient is a 18 year old male with a past psychiatric history significant for cannabis use disorder who presented to the [facility] emergency department on 11/06/2018 with suicidal ideation. He was brought to the emergency department by his grandmother. The patient stated that he had been having suicidal ideation for the last 2 years. He stated he had felt depressed. He stated he had a plan to shoot himself with a gun.   Objective: Patient is seen and examined.  Patient is a 18 year old male with the above-stated past psychiatric history who is seen in follow-up.  Initially he was very pleasant, smiling and somewhat engaging.  We discussed discharge planning.  He became very anxious, and withdrawn almost immediately.  He stated he did not want to return to his grandmother's home because of the disrespect fullness that his little brother gives his grandmother.  He stated "there is too much negative energy there.  He stated initially that he would go to his uncle and aunts once he was discharged.  We talked about the fact that we would need to contact his aunt and uncle to get collateral information and to make sure that they felt comfortable with him coming to their residence.  He again stated that "they do not want me to come there anyway".  He was just almost immediately more reticent, withdrawn, depressed.  He was unable to explain why this was so significant to him.  We had already discussed earlier cessation of marijuana, and he had stated that he was no longer going to smoke marijuana.  He had not discussed it with his older brother and they were all in agreement with that.  He currently is on Zoloft 25 mg p.o. daily as well as trazodone as needed.  His vital signs are stable.  He is bradycardic at a rate of 52.  I do not see an EKG in his records.  He slept 6.75 hours last night.  No new  laboratories.  He denied suicidal ideation.  He stated his preference was to be discharged to the street versus returning to the home of his grandmother.  Principal Problem: <principal problem not specified> Diagnosis: Active Problems:   MDD (major depressive disorder), recurrent severe, without psychosis (HCC)  Total Time spent with patient: 30 minutes  Past Psychiatric History: See admission H&P  Past Medical History:  Past Medical History:  Diagnosis Date  . Broken arm    History reviewed. No pertinent surgical history. Family History: History reviewed. No pertinent family history. Family Psychiatric  History: See admission H&P Social History:  Social History   Substance and Sexual Activity  Alcohol Use No     Social History   Substance and Sexual Activity  Drug Use Yes  . Types: Marijuana   Comment: daily    Social History   Socioeconomic History  . Marital status: Single    Spouse name: Not on file  . Number of children: Not on file  . Years of education: Not on file  . Highest education level: Not on file  Occupational History  . Not on file  Social Needs  . Financial resource strain: Not on file  . Food insecurity:    Worry: Not on file    Inability: Not on file  . Transportation needs:    Medical: Not on file    Non-medical: Not on file  Tobacco Use  .  Smoking status: Never Smoker  . Smokeless tobacco: Never Used  Substance and Sexual Activity  . Alcohol use: No  . Drug use: Yes    Types: Marijuana    Comment: daily  . Sexual activity: Not on file  Lifestyle  . Physical activity:    Days per week: Not on file    Minutes per session: Not on file  . Stress: Not on file  Relationships  . Social connections:    Talks on phone: Not on file    Gets together: Not on file    Attends religious service: Not on file    Active member of club or organization: Not on file    Attends meetings of clubs or organizations: Not on file    Relationship status:  Not on file  Other Topics Concern  . Not on file  Social History Narrative  . Not on file   Additional Social History:                         Sleep: Good  Appetite:  Good  Current Medications: Current Facility-Administered Medications  Medication Dose Route Frequency Provider Last Rate Last Dose  . acetaminophen (TYLENOL) tablet 650 mg  650 mg Oral Q6H PRN Money, Gerlene Burdock, FNP      . alum & mag hydroxide-simeth (MAALOX/MYLANTA) 200-200-20 MG/5ML suspension 30 mL  30 mL Oral Q4H PRN Money, Gerlene Burdock, FNP      . hydrOXYzine (ATARAX/VISTARIL) tablet 25 mg  25 mg Oral TID PRN Money, Gerlene Burdock, FNP      . magnesium hydroxide (MILK OF MAGNESIA) suspension 30 mL  30 mL Oral Daily PRN Money, Feliz Beam B, FNP      . nicotine polacrilex (NICORETTE) gum 2 mg  2 mg Oral PRN Antonieta Pert, MD   2 mg at 11/09/18 1258  . sertraline (ZOLOFT) tablet 50 mg  50 mg Oral Daily Cobos, Rockey Situ, MD   50 mg at 11/09/18 0759  . traZODone (DESYREL) tablet 50 mg  50 mg Oral QHS PRN Money, Gerlene Burdock, FNP   50 mg at 11/08/18 2131    Lab Results: No results found for this or any previous visit (from the past 48 hour(s)).  Blood Alcohol level:  Lab Results  Component Value Date   ETH <10 11/06/2018    Metabolic Disorder Labs: No results found for: HGBA1C, MPG No results found for: PROLACTIN No results found for: CHOL, TRIG, HDL, CHOLHDL, VLDL, LDLCALC  Physical Findings: AIMS: Facial and Oral Movements Muscles of Facial Expression: None, normal Lips and Perioral Area: None, normal Jaw: None, normal Tongue: None, normal,Extremity Movements Upper (arms, wrists, hands, fingers): None, normal Lower (legs, knees, ankles, toes): None, normal, Trunk Movements Neck, shoulders, hips: None, normal, Overall Severity Severity of abnormal movements (highest score from questions above): None, normal Incapacitation due to abnormal movements: None, normal Patient's awareness of abnormal movements (rate  only patient's report): No Awareness, Dental Status Current problems with teeth and/or dentures?: No Does patient usually wear dentures?: No  CIWA:    COWS:     Musculoskeletal: Strength & Muscle Tone: within normal limits Gait & Station: normal Patient leans: N/A  Psychiatric Specialty Exam: Physical Exam  Nursing note and vitals reviewed. Constitutional: He is oriented to person, place, and time. He appears well-developed and well-nourished.  HENT:  Head: Normocephalic and atraumatic.  Respiratory: Effort normal.  Neurological: He is alert and oriented to person, place, and time.  ROS  Blood pressure 135/82, pulse (!) 52, temperature (!) 97.4 F (36.3 C), resp. rate 16, height 5\' 2"  (1.575 m), weight 56.2 kg, SpO2 98 %.Body mass index is 22.68 kg/m.  General Appearance: Casual  Eye Contact:  Fair  Speech:  Normal Rate  Volume:  Decreased  Mood:  Anxious and Depressed  Affect:  Congruent  Thought Process:  Coherent and Descriptions of Associations: Circumstantial  Orientation:  Full (Time, Place, and Person)  Thought Content:  Logical  Suicidal Thoughts:  No  Homicidal Thoughts:  No  Memory:  Immediate;   Fair Recent;   Fair Remote;   Fair  Judgement:  Intact  Insight:  Fair  Psychomotor Activity:  Psychomotor Retardation  Concentration:  Concentration: Fair and Attention Span: Fair  Recall:  [employer] of Knowledge:  Fair  Language:  Good  Akathisia:  Negative  Handed:  Right  AIMS (if indicated):     Assets:  Desire for Improvement Physical Health Resilience  ADL's:  Intact  Cognition:  WNL  Sleep:  Number of Hours: 6.75     Treatment Plan Summary: Daily contact with patient to assess and evaluate symptoms and progress in treatment, Medication management and Plan : Patient is seen and examined.  Patient is a 18 year old male with the above-stated past psychiatric history who is seen in follow-up.  It is very unclear what happened today we did started  discussing his discharge planning.  He changed his personality's very dramatically.  He was smiling and engaging, and then the idea of being discharged back to his grandmother's, or his aunt and uncle really change the tempo of the interview.  I am going to increase his Zoloft to 50 mg p.o. daily.  I have also asked social work to contact his grandmother to find out more collateral information about while this should be happening.  Until we have a better handle on discharge planning I think we should just maintain were written to we get that collateral information. 1.  Continue hydroxyzine 25 mg p.o. 3 times daily as needed anxiety. 2.  Increase Zoloft to 50 mg p.o. daily for anxiety and depression. 3.  Continue trazodone 50 mg p.o. nightly as needed insomnia. 4.  Disposition planning-in progress.  Antonieta Pert, MD 11/09/2018, 3:03 PM

## 2018-11-09 NOTE — Plan of Care (Signed)
Progress note  D: pt found in bed; compliant with medication administration. Pt states he slept well. Pt rates his depression/hopelessness/anxiety a 2/0/4 out of 10 respectively. Pt has complaints of sweating, but denies pain, rating this a 0/10. Pt states his goal for today is to work on getting out and he will achieve this by talking to whoever discharges people. Pt denies si/hi/ah/vh and verbally agrees to approach staff if these become apparent or before harming himself/others while at [facility]. Pt is minimal on his assessment but pleasant.  A: pt provided support and encouragement. Pt given medication per protocol and standing orders. Q4m safety checks implemented and continued.  R: pt safe on the unit. Will continue to monitor.   Pt progressing in the following metrics  Problem: Education: Goal: Emotional status will improve Outcome: Progressing Goal: Mental status will improve Outcome: Progressing Goal: Verbalization of understanding the information provided will improve Outcome: Progressing   Problem: Education: Goal: Utilization of techniques to improve thought processes will improve Outcome: Progressing

## 2018-11-09 NOTE — [facility] Suicide Risk Assessment (Signed)
[facility] INPATIENT:  Family/Significant Other Suicide Prevention Education  Suicide Prevention Education:  Education Completed; grandmother, [name] [phone]) has been identified by the patient as the family member/significant other with whom the patient will be residing, and identified as the person(s) who will aid the patient in the event of a mental health crisis (suicidal ideations/suicide attempt).  With written consent from the patient, the family member/significant other has been provided the following suicide prevention education, prior to the and/or following the discharge of the patient.  The suicide prevention education provided includes the following:  Suicide risk factors  Suicide prevention and interventions  National Suicide Hotline telephone number  [facility] assessment telephone number  [facility] Emergency Assistance 911  [facility] and/or Residential Mobile Crisis Unit telephone number  Request made of family/significant other to:  Remove weapons (e.g., guns, rifles, knives), all items previously/currently identified as safety concern.    Remove drugs/medications (over-the-counter, prescriptions, illicit drugs), all items previously/currently identified as a safety concern.  The family member/significant other verbalizes understanding of the suicide prevention education information provided.  The family member/significant other agrees to remove the items of safety concern listed above.  Patient's grandmother reports that the patient has experienced depression for many years and that it has progressed as the patient has become older. She reports that while the patient is in the hospital, she would like for him to form a plan in regards to school. Patient's grandmother also reports that she would like to the patient to be stabilized on medications to address his depression in addition to learning better coping skills. CSW will continue to  follow.   Cody Kelley 11/09/2018, 1:53 PM

## 2018-11-09 NOTE — Progress Notes (Signed)
Patient attended grief and loss group facilitated by Ethel Rana, MS, [facility], NCC.   Group focuses on change and loss experienced by group members; topics include loss due to death, loss of relationships, loss of a place, loss of sense of self, etc. Group members are invited to share the changes and losses that are currently affecting their lives. Facilitators tie together shared experiences between group members and validate emotions felt by participants.  Group members participated in a visual art activity in which they chose photos that resonated with their experience of grief. Members were given the opportunity to share the photo they chose with the group.   Patient Cody Kelley was present for approximately half of group. Pt did not participate in facilitated discussion but did participate in the art activity. Pt shared a photo he chose of flowers and reported that when he feels angry and upset he tries to find the bright side in things.   Ethel Rana, MS, [facility], NCC

## 2018-11-09 NOTE — [facility] Group Notes (Signed)
LCSW Group Therapy Note 11/09/2018 3:28 PM  Type of Therapy and Topic: Group Therapy: Overcoming Obstacles  Participation Level: Active  Description of Group:  In this group patients will be encouraged to explore what they see as obstacles to their own wellness and recovery. They will be guided to discuss their thoughts, feelings, and behaviors related to these obstacles. The group will process together ways to cope with barriers, with attention given to specific choices patients can make. Each patient will be challenged to identify changes they are motivated to make in order to overcome their obstacles. This group will be process-oriented, with patients participating in exploration of their own experiences as well as giving and receiving support and challenge from other group members.  Therapeutic Goals: 1. Patient will identify personal and current obstacles as they relate to admission. 2. Patient will identify barriers that currently interfere with their wellness or overcoming obstacles.  3. Patient will identify feelings, thought process and behaviors related to these barriers. 4. Patient will identify two changes they are willing to make to overcome these obstacles:   Summary of Patient Progress  Cody Kelley was engaged and participated throughout the group session. Cody Kelley reports his main obstacle is "school". Cody Kelley states his main barriers are being overwhelmed and having anxiety.     Therapeutic Modalities:  Cognitive Behavioral Therapy Solution Focused Therapy Motivational Interviewing Relapse Prevention Therapy   Alcario Drought Clinical Social Worker

## 2018-11-10 NOTE — Progress Notes (Signed)
Patient stated that he was proud of the fact that he completed a puzzle with the assistance of his peers. He stated that he was proud of the fact that he went outside for fresh air as well. His goal for tomorrow is to get discharged.

## 2018-11-10 NOTE — Plan of Care (Signed)
Progress note  D: pt found in bed; compliant with medication administration. Pt states he slept well last night. Pt rates his depression/hopelessness/anxiety a 2/0/3 out of 10 respectively. Pt has complaints of a headache that he rates at a 4-6/10. Pt denies any other symptoms besides cravings for nicotine. Pt states his goal for today is to work on getting out of here and starting these programs. He will achieve this by discharging, going home and calling these programs. Pt denies si/hi/ah/vh and verbally agrees to approach staff if these become apparent or before harming himself/others while at [facility].  A: pt provided support and encouragement. Pt given medication per protocol and standing orders. Q29m safety checks implemented and continued.  R: pt safe on the unit. Will continue to monitor.   Pt progressing in the following metrics  Problem: Education: Goal: Knowledge of the prescribed therapeutic regimen will improve Outcome: Progressing   Problem: Self-Concept: Goal: Ability to disclose and discuss suicidal ideas will improve Outcome: Progressing Goal: Will verbalize positive feelings about self Outcome: Progressing

## 2018-11-10 NOTE — Progress Notes (Signed)
Nursing Progress Note: 7p-7a D: Pt currently presents with a flat/blocking/empty affect and behavior. Interacting appropriately with the milieu. Pt reports good sleep during the previous night with current medication regimen. Pt did attend wrap-up group.  A: Pt provided with medications per providers orders. Pt's labs and vitals were monitored throughout the night. Pt supported emotionally and encouraged to express concerns and questions. Pt educated on medications.  R: Pt's safety ensured with 15 minute and environmental checks. Pt currently denies SI, HI, and AVH. Pt verbally contracts to seek staff if SI,HI, or AVH occurs and to consult with staff before acting on any harmful thoughts. Will continue to monitor.

## 2018-11-10 NOTE — Progress Notes (Signed)
Nursing Progress Note: 7p-7a D: Pt currently presents with a flat/empty/minimal/blunted/blocking/concrete affect and behavior. Pt states "I am not going to go back to my grandma's. I want to go live with my friend." Interacting minimally with the milieu. Pt reports fair sleep during the previous night with current medication regimen.  A: Pt provided with medications per providers orders. Pt's labs and vitals were monitored throughout the night. Pt supported emotionally and encouraged to express concerns and questions. Pt educated on medications.  R: Pt's safety ensured with 15 minute and environmental checks. Pt currently denies SI, HI, and AVH. Pt verbally contracts to seek staff if SI,HI, or AVH occurs and to consult with staff before acting on any harmful thoughts. Will continue to monitor.

## 2018-11-10 NOTE — Progress Notes (Signed)
[facility] MD Progress Note  11/10/2018 12:58 PM Sierra AMADEO PIHL  MRN:  [id number] Subjective:    Patient is a 18 year old male with a past psychiatric history significant for cannabis use disorder who presented to the [facility] emergency department on 11/06/2018 with suicidal ideation. He was brought to the emergency department by his grandmother. The patient stated that he had been having suicidal ideation for the last 2 years. He stated he had felt depressed. He stated he had a plan to shoot himself with a gun.   Objective: Patient is seen and examined.  Patient is a 18 year old male with the above-stated past psychiatric history is seen in follow-up.  He is better today.  We discussed his discharge issues, and he is more comfortable with the idea of returning home to his grandmother's.  He said he feels like his little brother needs treatment like he is getting, and that his older brother has gotten.  He denied any suicidal ideation.  He denied any side effects to his current medications.  Vital signs are stable, he is afebrile.  He slept 6 hours last night.  Social work discussed the case with his grandmother, and and she did not see any obstacles to him returning in the home.  Principal Problem: <principal problem not specified> Diagnosis: Active Problems:   MDD (major depressive disorder), recurrent severe, without psychosis (HCC)  Total Time spent with patient: 20 minutes  Past Psychiatric History: See admission H&P  Past Medical History:  Past Medical History:  Diagnosis Date  . Broken arm    History reviewed. No pertinent surgical history. Family History: History reviewed. No pertinent family history. Family Psychiatric  History: See admission H&P Social History:  Social History   Substance and Sexual Activity  Alcohol Use No     Social History   Substance and Sexual Activity  Drug Use Yes  . Types: Marijuana   Comment: daily    Social History    Socioeconomic History  . Marital status: Single    Spouse name: Not on file  . Number of children: Not on file  . Years of education: Not on file  . Highest education level: Not on file  Occupational History  . Not on file  Social Needs  . Financial resource strain: Not on file  . Food insecurity:    Worry: Not on file    Inability: Not on file  . Transportation needs:    Medical: Not on file    Non-medical: Not on file  Tobacco Use  . Smoking status: Never Smoker  . Smokeless tobacco: Never Used  Substance and Sexual Activity  . Alcohol use: No  . Drug use: Yes    Types: Marijuana    Comment: daily  . Sexual activity: Not on file  Lifestyle  . Physical activity:    Days per week: Not on file    Minutes per session: Not on file  . Stress: Not on file  Relationships  . Social connections:    Talks on phone: Not on file    Gets together: Not on file    Attends religious service: Not on file    Active member of club or organization: Not on file    Attends meetings of clubs or organizations: Not on file    Relationship status: Not on file  Other Topics Concern  . Not on file  Social History Narrative  . Not on file   Additional Social History:  Sleep: Good  Appetite:  Good  Current Medications: Current Facility-Administered Medications  Medication Dose Route Frequency Provider Last Rate Last Dose  . acetaminophen (TYLENOL) tablet 650 mg  650 mg Oral Q6H PRN Money, Gerlene Burdock, FNP   650 mg at 11/10/18 0754  . alum & mag hydroxide-simeth (MAALOX/MYLANTA) 200-200-20 MG/5ML suspension 30 mL  30 mL Oral Q4H PRN Money, Gerlene Burdock, FNP      . hydrOXYzine (ATARAX/VISTARIL) tablet 25 mg  25 mg Oral TID PRN Money, Gerlene Burdock, FNP   25 mg at 11/09/18 2248  . magnesium hydroxide (MILK OF MAGNESIA) suspension 30 mL  30 mL Oral Daily PRN Money, Feliz Beam B, FNP      . nicotine polacrilex (NICORETTE) gum 2 mg  2 mg Oral PRN Antonieta Pert, MD   2  mg at 11/10/18 1005  . sertraline (ZOLOFT) tablet 50 mg  50 mg Oral Daily Cobos, Rockey Situ, MD   50 mg at 11/10/18 0754  . traZODone (DESYREL) tablet 50 mg  50 mg Oral QHS PRN Money, Gerlene Burdock, FNP   50 mg at 11/09/18 2249    Lab Results: No results found for this or any previous visit (from the past 48 hour(s)).  Blood Alcohol level:  Lab Results  Component Value Date   ETH <10 11/06/2018    Metabolic Disorder Labs: No results found for: HGBA1C, MPG No results found for: PROLACTIN No results found for: CHOL, TRIG, HDL, CHOLHDL, VLDL, LDLCALC  Physical Findings: AIMS: Facial and Oral Movements Muscles of Facial Expression: None, normal Lips and Perioral Area: None, normal Jaw: None, normal Tongue: None, normal,Extremity Movements Upper (arms, wrists, hands, fingers): None, normal Lower (legs, knees, ankles, toes): None, normal, Trunk Movements Neck, shoulders, hips: None, normal, Overall Severity Severity of abnormal movements (highest score from questions above): None, normal Incapacitation due to abnormal movements: None, normal Patient's awareness of abnormal movements (rate only patient's report): No Awareness, Dental Status Current problems with teeth and/or dentures?: No Does patient usually wear dentures?: No  CIWA:    COWS:     Musculoskeletal: Strength & Muscle Tone: within normal limits Gait & Station: normal Patient leans: N/A  Psychiatric Specialty Exam: Physical Exam  Nursing note and vitals reviewed. Constitutional: He is oriented to person, place, and time. He appears well-developed and well-nourished.  HENT:  Head: Normocephalic and atraumatic.  Respiratory: Effort normal.  Neurological: He is alert and oriented to person, place, and time.    ROS  Blood pressure 137/87, pulse 65, temperature 97.7 F (36.5 C), temperature source Oral, resp. rate 16, height  (1.575 m), weight 56.2 kg, SpO2 98 %.Body mass index is 22.68 kg/m.  General Appearance:  Casual  Eye Contact:  Good  Speech:  Normal Rate  Volume:  Normal  Mood:  Euthymic  Affect:  Congruent  Thought Process:  Coherent and Descriptions of Associations: Intact  Orientation:  Full (Time, Place, and Person)  Thought Content:  Logical  Suicidal Thoughts:  No  Homicidal Thoughts:  No  Memory:  Immediate;   Fair Recent;   Fair Remote;   Fair  Judgement:  Intact  Insight:  Fair  Psychomotor Activity:  Normal  Concentration:  Concentration: Fair and Attention Span: Fair  Recall:  [employer] of Knowledge:  Fair  Language:  Good  Akathisia:  Negative  Handed:  Right  AIMS (if indicated):     Assets:  Desire for Improvement Housing Leisure Time Physical Health Resilience Social Support  ADL's:  Intact  Cognition:  WNL  Sleep:  Number of Hours: 6     Treatment Plan Summary: Daily contact with patient to assess and evaluate symptoms and progress in treatment, Medication management and Plan : Patient is seen and examined.  Patient is a 18 year old male with the above-stated past psychiatric history who is seen in follow-up.  Patient is doing better today.  His mood is more stable.  He is not anxious about returning home.  He denied any side effects to his current medications.  He denied any suicidal ideation.  No change in his current medications.  We will plan on discharge tomorrow as long as everything continues to go well. 1.  Continue hydroxyzine 25 mg p.o. 3 times daily as needed anxiety. 2.  Continue Zoloft 50 mg p.o. daily for anxiety and depression. 3.  Continue trazodone 50 mg p.o. nightly as needed insomnia. 4.  Disposition planning-in progress.  Antonieta Pert, MD 11/10/2018, 12:58 PM

## 2018-11-11 MED ORDER — TRAZODONE HCL 50 MG PO TABS: 50.0000 mg | ORAL_TABLET | Freq: Every evening | ORAL | 0 refills | Status: DC | PRN

## 2018-11-11 MED ORDER — HYDROXYZINE HCL 25 MG PO TABS: 25.0000 mg | ORAL_TABLET | Freq: Three times a day (TID) | ORAL | 0 refills | Status: DC | PRN

## 2018-11-11 MED ORDER — SERTRALINE HCL 50 MG PO TABS: 50.0000 mg | ORAL_TABLET | Freq: Every day | ORAL | 0 refills | Status: DC

## 2018-11-11 NOTE — Plan of Care (Signed)
Progress note  D: pt found in the hallway; compliant with medication administration. Pt is animated with other peers on the hall. Pt denies any pain, but has complaints of withdrawal symptoms. Pt denies si/hi/ah/vh and verbally agrees to approach staff if these become apparent or before harming himself/others while at [facility]. Pt is still minimal with assessment. Pt planned to be discharged tomorrow.  A: pt provided support and encouragement. Pt given medication per protocol and standing orders. Q104m safety checks implemented and continued.  R: pt safe on the unit. Will continue to monitor.   Pt progressing the following metrics  Problem: Education: Goal: Knowledge of [place] [facility] Education information/materials will improve Outcome: Progressing Goal: Emotional status will improve Outcome: Progressing Goal: Mental status will improve Outcome: Progressing Goal: Verbalization of understanding the information provided will improve Outcome: Progressing

## 2018-11-11 NOTE — Discharge Summary (Signed)
Physician Discharge Summary Note  Patient:  Cody Kelley is an 18 y.o., male MRN:  [id number] DOB:  02-18-2001 Patient phone:  [phone] (home)  Patient address:   [address] [place] [address],  Total Time spent with patient: 20 minutes  Date of Admission:  11/06/2018 Date of Discharge: 11/11/18  Reason for Admission:  Worsening depression with SI  Principal Problem: MDD (major depressive disorder), recurrent severe, without psychosis (HCC) Discharge Diagnoses: Principal Problem:   MDD (major depressive disorder), recurrent severe, without psychosis (HCC)   Past Psychiatric History: Patient denied any previous formal psychiatric history.  He did receive counseling as a child.  He is not been treated with any medications before.  He has not had any previous psychiatric admissions  Past Medical History:  Past Medical History:  Diagnosis Date  . Broken arm    History reviewed. No pertinent surgical history. Family History: History reviewed. No pertinent family history. Family Psychiatric  History: Patient stated his brother has been admitted to our unit previously, but he is unsure of what his diagnosis is.  He did state that he had an uncle with schizophrenia. Social History:  Social History   Substance and Sexual Activity  Alcohol Use No     Social History   Substance and Sexual Activity  Drug Use Yes  . Types: Marijuana   Comment: daily    Social History   Socioeconomic History  . Marital status: Single    Spouse name: Not on file  . Number of children: Not on file  . Years of education: Not on file  . Highest education level: Not on file  Occupational History  . Not on file  Social Needs  . Financial resource strain: Not on file  . Food insecurity:    Worry: Not on file    Inability: Not on file  . Transportation needs:    Medical: Not on file    Non-medical: Not on file  Tobacco Use  . Smoking status: Never Smoker  . Smokeless tobacco: Never  Used  Substance and Sexual Activity  . Alcohol use: No  . Drug use: Yes    Types: Marijuana    Comment: daily  . Sexual activity: Not on file  Lifestyle  . Physical activity:    Days per week: Not on file    Minutes per session: Not on file  . Stress: Not on file  Relationships  . Social connections:    Talks on phone: Not on file    Gets together: Not on file    Attends religious service: Not on file    Active member of club or organization: Not on file    Attends meetings of clubs or organizations: Not on file    Relationship status: Not on file  Other Topics Concern  . Not on file  Social History Narrative  . Not on file    Hospital Course:   11/07/18 [facility] MD Assessment: 18 year old male with a past psychiatric history significant for cannabis use disorder who presented to the [facility] emergency department on 11/06/2018 with suicidal ideation. He was brought to the emergency department by his grandmother. The patient stated that he had been having suicidal ideation for the last 2 years. He stated he had felt depressed. He stated he had a plan to shoot himself with a gun. The patient and grandmother in the emergency department stated that he had no access to weapons. The patient stated that  he had been depressed since his mother became dependent on drugs and his father was incarcerated. He apparently has been living with the grandmother since he was in fifth grade. He has not been seen by a psychiatrist before. He did receive counseling with something related to issues with his mother and father as an adolescent. Patient stated that he does not work, and plays video games all day while smoking marijuana. He did admit to racing thoughts and some high anxiety. He smiled and engaged throughout the interview. He denied any previous self-harm in the past. He admitted to helplessness, hopelessness and worthlessness. He was admitted to the hospital for evaluation  and stabilization.  Patient remained on the [facility] unit for 4 days. The patient stabilized on medication and therapy. Patient was discharged on Zoloft 50 mg Daily, Trazodone 50 mg QHS PRN, and Vistaril 25 mg TID PRN. Patient has shown improvement with improved mood, affect, sleep, appetite, and interaction. Patient has attended group and participated. Patient has been seen in the day room interacting with peers and staff appropriately. Patient denies any SI/HI/AVH and contracts for safety. Patient agrees to follow up at [facility]. Patient is provided with prescriptions for their medications upon discharge.  Physical Findings: AIMS: Facial and Oral Movements Muscles of Facial Expression: None, normal Lips and Perioral Area: None, normal Jaw: None, normal Tongue: None, normal,Extremity Movements Upper (arms, wrists, hands, fingers): None, normal Lower (legs, knees, ankles, toes): None, normal, Trunk Movements Neck, shoulders, hips: None, normal, Overall Severity Severity of abnormal movements (highest score from questions above): None, normal Incapacitation due to abnormal movements: None, normal Patient's awareness of abnormal movements (rate only patient's report): No Awareness, Dental Status Current problems with teeth and/or dentures?: No Does patient usually wear dentures?: No  CIWA:    COWS:     Musculoskeletal: Strength & Muscle Tone: within normal limits Gait & Station: normal Patient leans: N/A  Psychiatric Specialty Exam: Physical Exam  Nursing note and vitals reviewed. Constitutional: He is oriented to person, place, and time. He appears well-developed and well-nourished.  Cardiovascular: Normal rate.  Respiratory: Effort normal.  Musculoskeletal: Normal range of motion.  Neurological: He is alert and oriented to person, place, and time.  Skin: Skin is warm.    Review of Systems  Constitutional: Negative.   HENT: Negative.   Eyes: Negative.   Respiratory: Negative.    Cardiovascular: Negative.   Gastrointestinal: Negative.   Genitourinary: Negative.   Musculoskeletal: Negative.   Skin: Negative.   Neurological: Negative.   Endo/Heme/Allergies: Negative.   Psychiatric/Behavioral: Negative.     Blood pressure 115/83, pulse 73, temperature 97.9 F (36.6 C), temperature source Oral, resp. rate 16, height  (1.575 m), weight 56.2 kg, SpO2 98 %.Body mass index is 22.68 kg/m.  General Appearance: Casual  Eye Contact:  Good  Speech:  Clear and Coherent and Normal Rate  Volume:  Normal  Mood:  Euthymic  Affect:  Congruent  Thought Process:  Coherent and Descriptions of Associations: Intact  Orientation:  Full (Time, Place, and Person)  Thought Content:  WDL  Suicidal Thoughts:  No  Homicidal Thoughts:  No  Memory:  Immediate;   Good Recent;   Good Remote;   Good  Judgement:  Fair  Insight:  Good  Psychomotor Activity:  Normal  Concentration:  Concentration: Good and Attention Span: Good  Recall:  Good  Fund of Knowledge:  Good  Language:  Good  Akathisia:  No  Handed:  Right  AIMS (if indicated):  Assets:  Communication Skills Desire for Improvement Financial Resources/Insurance Housing Physical Health Social Support Transportation  ADL's:  Intact  Cognition:  WNL  Sleep:  Number of Hours: 6.25     Have you used any form of tobacco in the last 30 days? (Cigarettes, Smokeless Tobacco, Cigars, and/or Pipes): Yes  Has this patient used any form of tobacco in the last 30 days? (Cigarettes, Smokeless Tobacco, Cigars, and/or Pipes) Yes, Yes, A prescription for an FDA-approved tobacco cessation medication was offered at discharge and the patient refused  Blood Alcohol level:  Lab Results  Component Value Date   ETH <10 11/06/2018    Metabolic Disorder Labs:  No results found for: HGBA1C, MPG No results found for: PROLACTIN No results found for: CHOL, TRIG, HDL, CHOLHDL, VLDL, LDLCALC  See Psychiatric Specialty Exam and  Suicide Risk Assessment completed by Attending Physician prior to discharge.  Discharge destination:  Home  Is patient on multiple antipsychotic therapies at discharge:  No   Has Patient had three or more failed trials of antipsychotic monotherapy by history:  No  Recommended Plan for Multiple Antipsychotic Therapies: NA   Allergies as of 11/11/2018   No Known Allergies     Medication List    TAKE these medications     Indication  hydrOXYzine 25 MG tablet Commonly known as:  ATARAX/VISTARIL Take 1 tablet (25 mg total) by mouth 3 (three) times daily as needed for anxiety.  Indication:  Feeling Anxious   sertraline 50 MG tablet Commonly known as:  ZOLOFT Take 1 tablet (50 mg total) by mouth daily. Start taking on:  November 12, 2018  Indication:  Major Depressive Disorder   traZODone 50 MG tablet Commonly known as:  DESYREL Take 1 tablet (50 mg total) by mouth at bedtime as needed for sleep.  Indication:  Trouble Sleeping      Follow-up Information    [facility] Follow up on 11/12/2018.   Why:  Hospital follow up appointment is Friday, 3/13 at 8:15a. Please bring your photo ID, proof of insurance, SSN, current medications, and discharge paperwork from this hospitalization.  Contact information: [address] [place] [address] [phone]           Follow-up recommendations:  Continue activity as tolerated. Continue diet as recommended by your PCP. Ensure to keep all appointments with outpatient providers.  Comments:  Patient is instructed prior to discharge to: Take all medications as prescribed by his/her mental healthcare provider. Report any adverse effects and or reactions from the medicines to his/her outpatient provider promptly. Patient has been instructed & cautioned: To not engage in alcohol and or illegal drug use while on prescription medicines. In the event of worsening symptoms, patient is instructed to call the crisis hotline, 911 and or go to the  nearest ED for appropriate evaluation and treatment of symptoms. To follow-up with his/her primary care provider for your other medical issues, concerns and or health care needs.    Signed: Gerlene Burdock Genese Quebedeaux, FNP 11/11/2018, 8:41 AM

## 2018-11-11 NOTE — Progress Notes (Signed)
  [facility] Adult Case Management Discharge Plan :  Will you be returning to the same living situation after discharge:  Yes,  patient reports he is returning to his grandmother's home At discharge, do you have transportation home?: Yes,  patient reports his grandmother is picking up at discharge Do you have the ability to pay for your medications: No.  Release of information consent forms completed and in the chart;  Patient's signature needed at discharge.  Patient to Follow up at: Follow-up Information    [facility] Follow up on 11/12/2018.   Why:  Hospital follow up appointment is Friday, 3/13 at 8:15a. Please bring your photo ID, proof of insurance, SSN, current medications, and discharge paperwork from this hospitalization.  Contact information: [address] [place] [address] [phone]           Next level of care provider has access to [facility]:yes  Safety Planning and Suicide Prevention discussed: Yes,  with the patient's grandmother  Have you used any form of tobacco in the last 30 days? (Cigarettes, Smokeless Tobacco, Cigars, and/or Pipes): Yes  Has patient been referred to the Quitline?: Patient refused referral  Patient has been referred for addiction treatment: N/A  Maeola Sarah, LCSWA 11/11/2018, 11:57 AM

## 2018-11-11 NOTE — BHH Suicide Risk Assessment (Signed)
[facility] Discharge Suicide Risk Assessment   Principal Problem: <principal problem not specified> Discharge Diagnoses: Active Problems:   MDD (major depressive disorder), recurrent severe, without psychosis (HCC)   Total Time spent with patient: 15 minutes  Musculoskeletal: Strength & Muscle Tone: within normal limits Gait & Station: normal Patient leans: N/A  Psychiatric Specialty Exam: Review of Systems  All other systems reviewed and are negative.   Blood pressure 115/83, pulse 73, temperature 97.9 F (36.6 C), temperature source Oral, resp. rate 16, height 5\' 2"  (1.575 m), weight 56.2 kg, SpO2 98 %.Body mass index is 22.68 kg/m.  General Appearance: Casual  Eye Contact::  Fair  Speech:  Normal Rate409  Volume:  Normal  Mood:  Anxious  Affect:  Congruent  Thought Process:  Coherent and Descriptions of Associations: Intact  Orientation:  Full (Time, Place, and Person)  Thought Content:  Logical  Suicidal Thoughts:  No  Homicidal Thoughts:  No  Memory:  Immediate;   Fair Recent;   Fair Remote;   Fair  Judgement:  Intact  Insight:  Fair  Psychomotor Activity:  Increased  Concentration:  Fair  Recall:  [employer] of Knowledge:Fair  Language: Fair  Akathisia:  Negative  Handed:  Right  AIMS (if indicated):     Assets:  Desire for Improvement Housing Leisure Time Physical Health Resilience  Sleep:  Number of Hours: 6.25  Cognition: WNL  ADL's:  Intact   Mental Status Per Nursing Assessment::   On Admission:  Self-harm thoughts  Demographic Factors:  Male, Adolescent or young adult, Caucasian, Low socioeconomic status and Unemployed  Loss Factors: NA  Historical Factors: Impulsivity  Risk Reduction Factors:   Sense of responsibility to family, Living with another person, especially a relative and Positive social support  Continued Clinical Symptoms:  Depression:   Comorbid alcohol abuse/dependence Impulsivity Alcohol/Substance  Abuse/Dependencies  Cognitive Features That Contribute To Risk:  None    Suicide Risk:  Minimal: No identifiable suicidal ideation.  Patients presenting with no risk factors but with morbid ruminations; may be classified as minimal risk based on the severity of the depressive symptoms  Follow-up Information    [facility] Follow up on 11/12/2018.   Why:  Hospital follow up appointment is Friday, 3/13 at 8:15a. Please bring your photo ID, proof of insurance, SSN, current medications, and discharge paperwork from this hospitalization.  Contact information: [address] [place] [address] [phone]           Plan Of Care/Follow-up recommendations:  Activity:  ad lib  Antonieta Pert, MD 11/11/2018, 8:28 AM

## 2018-11-11 NOTE — BHH Group Notes (Signed)
[facility] Mental Health Association Group Therapy      11/11/2018 1:36 PM  Type of Therapy: Mental Health Association Presentation  Participation Level: Active  Participation Quality: Attentive  Affect: Appropriate  Cognitive: Oriented  Insight: Developing/Improving  Engagement in Therapy: Engaged  Modes of Intervention: Discussion, Education and Socialization  Summary of Progress/Problems: Mental Health Association (MHA) Speaker came to talk about his personal journey with mental health. The pt processed ways by which to relate to the speaker. MHA speaker provided handouts and educational information pertaining to groups and services offered by the [facility]. Pt was engaged in speaker's presentation and was receptive to resources provided.    Alcario Drought Clinical Social Worker

## 2018-11-11 NOTE — Plan of Care (Signed)
Discharge note  Patient verbalizes readiness for discharge. Follow up plan explained, AVS, Transition record and SRA given. Prescriptions and teaching provided. Belongings returned and signed for. Suicide safety plan completed and signed. Patient verbalizes understanding. Patient denies SI/HI and assures this Clinical research associate he will seek assistance should that change. Patient discharged to lobby where grandmother was waiting.  Problem: Education: Goal: Knowledge of [facility] Education information/materials will improve 11/11/2018 1434 by Raylene Miyamoto, RN Outcome: Adequate for Discharge 11/11/2018 1413 by Raylene Miyamoto, RN Outcome: Progressing Goal: Emotional status will improve 11/11/2018 1434 by Raylene Miyamoto, RN Outcome: Adequate for Discharge 11/11/2018 1413 by Raylene Miyamoto, RN Outcome: Progressing Goal: Mental status will improve 11/11/2018 1434 by Raylene Miyamoto, RN Outcome: Adequate for Discharge 11/11/2018 1413 by Raylene Miyamoto, RN Outcome: Progressing Goal: Verbalization of understanding the information provided will improve 11/11/2018 1434 by Raylene Miyamoto, RN Outcome: Adequate for Discharge 11/11/2018 1413 by Raylene Miyamoto, RN Outcome: Progressing   Problem: Coping: Goal: Ability to identify and develop effective coping behavior will improve Outcome: Adequate for Discharge   Problem: Education: Goal: Utilization of techniques to improve thought processes will improve Outcome: Adequate for Discharge Goal: Knowledge of the prescribed therapeutic regimen will improve Outcome: Adequate for Discharge   Problem: Medication: Goal: Compliance with prescribed medication regimen will improve Outcome: Adequate for Discharge   Problem: Self-Concept: Goal: Ability to disclose and discuss suicidal ideas will improve Outcome: Adequate for Discharge Goal: Will verbalize positive feelings about self Outcome: Adequate for Discharge

## 2019-02-11 DIAGNOSIS — F322 Major depressive disorder, single episode, severe without psychotic features: Secondary | ICD-10-CM | POA: Diagnosis not present

## 2019-03-07 ENCOUNTER — Ambulatory Visit: Payer: Self-pay | Admitting: Family Medicine

## 2019-04-26 ENCOUNTER — Ambulatory Visit: Payer: Medicaid Other | Attending: Nurse Practitioner | Admitting: Nurse Practitioner

## 2019-04-26 ENCOUNTER — Encounter: Payer: Self-pay | Admitting: Nurse Practitioner

## 2019-04-26 ENCOUNTER — Other Ambulatory Visit: Payer: Self-pay

## 2019-04-26 DIAGNOSIS — F32A Depression, unspecified: Secondary | ICD-10-CM

## 2019-04-26 DIAGNOSIS — F419 Anxiety disorder, unspecified: Secondary | ICD-10-CM | POA: Diagnosis not present

## 2019-04-26 DIAGNOSIS — F329 Major depressive disorder, single episode, unspecified: Secondary | ICD-10-CM | POA: Diagnosis not present

## 2019-04-26 DIAGNOSIS — Z79899 Other long term (current) drug therapy: Secondary | ICD-10-CM | POA: Diagnosis not present

## 2019-04-26 NOTE — Progress Notes (Signed)
Virtual Visit via Telephone Note Due to national recommendations of social distancing due to COVID 19, telehealth visit is felt to be most appropriate for this patient at this time.  I discussed the limitations, risks, security and privacy concerns of performing an evaluation and management service by telephone and the availability of in person appointments. I also discussed with the patient that there may be a patient responsible charge related to this service. The patient expressed understanding and agreed to proceed.    I connected with Ramon A Bernadene BellL Magallon on 04/26/19  at   1:50 PM EDT  EDT by telephone and verified that I am speaking with the correct person using two identifiers.   Consent I discussed the limitations, risks, security and privacy concerns of performing an evaluation and management service by telephone and the availability of in person appointments. I also discussed with the patient that there may be a patient responsible charge related to this service. The patient expressed understanding and agreed to proceed.   Location of Patient: Private  Residence    [facility]    Persons participating in Telemedicine visit: Bertram DenverZelda Fleming FNP-BC YY PinonBien CMA [place] A Bernadene BellL Hargrove    History of Present Illness: Telemedicine visit for: Establish Care  has a past medical history of Anxiety, Broken arm, and Depression.   [facility] and has therapy appointment tomorrow. Taking trazodone 50mg  at bedtime. zoloft 50mg  dialy and hydroxyzine 25mg  TID for anxiety. Reports improved symptoms since he was [facility] with [facility]. At that time he had been admitted for suicidal ideation and was brought in by his grandmother. He had plans to shoot himself with a gun.  The patient stated that he had been depressed since his mother became dependent on drugs and his father was incarcerated. He apparently has been living with  the grandmother since he was in fifth grade. Denies any current thoughts of self harm Depression screen [facility] 2/9 04/26/2019  Decreased Interest 0  Down, Depressed, Hopeless 0  PHQ - 2 Score 0  Altered sleeping 0  Tired, decreased energy 0  Change in appetite 0  Feeling bad or failure about yourself  0  Trouble concentrating 1  Moving slowly or fidgety/restless 0  Suicidal thoughts 0  PHQ-9 Score 1   GAD 7 : Generalized Anxiety Score 04/26/2019  Nervous, Anxious, on Edge 0  Control/stop worrying 1  Worry too much - different things 1  Trouble relaxing 3  Restless 0  Easily annoyed or irritable 0  Afraid - awful might happen 0  Total GAD 7 Score 5       Past Medical History:  Diagnosis Date  . Anxiety   . Broken arm   . Depression     Past Surgical History:  Procedure Laterality Date  . NO PAST SURGERIES      Family History  Problem Relation Age of Onset  . Schizophrenia Paternal Uncle   . Diabetes Neg Hx   . Hypertension Neg Hx     Social History   Socioeconomic History  . Marital status: Single    Spouse name: Not on file  . Number of children: Not on file  . Years of education: Not on file  . Highest education level: Not on file  Occupational History  . Not on file  Social Needs  . Financial resource strain: Not on file  . Food insecurity    Worry:  Not on file    Inability: Not on file  . Transportation needs    Medical: Not on file    Non-medical: Not on file  Tobacco Use  . Smoking status: Never Smoker  . Smokeless tobacco: Never Used  Substance and Sexual Activity  . Alcohol use: Yes  . Drug use: Not Currently    Types: Marijuana    Comment: daily  . Sexual activity: Yes  Lifestyle  . Physical activity    Days per week: Not on file    Minutes per session: Not on file  . Stress: Not on file  Relationships  . Social Herbalist on phone: Not on file    Gets together: Not on file    Attends religious service: Not on file     Active member of club or organization: Not on file    Attends meetings of clubs or organizations: Not on file    Relationship status: Not on file  Other Topics Concern  . Not on file  Social History Narrative  . Not on file     Observations/Objective: Awake, alert and oriented x 3   Review of Systems  Constitutional: Negative for fever, malaise/fatigue and weight loss.  HENT: Negative.  Negative for nosebleeds.   Eyes: Negative.  Negative for blurred vision, double vision and photophobia.  Respiratory: Negative.  Negative for cough and shortness of breath.   Cardiovascular: Negative.  Negative for chest pain, palpitations and leg swelling.  Gastrointestinal: Negative.  Negative for heartburn, nausea and vomiting.  Musculoskeletal: Negative.  Negative for myalgias.  Neurological: Negative.  Negative for dizziness, focal weakness, seizures and headaches.  Psychiatric/Behavioral: Positive for depression and substance abuse. Negative for hallucinations, memory loss and suicidal ideas. The patient is nervous/anxious and has insomnia.     Assessment and Plan:  Diagnoses and all orders for this visit:  Anxiety and depression Continue all medications as prescribed.  He declines any refills today stating he just picked them up a few days ago.  He is aware to continue following up with [facility] for his psychiatric needs   Follow Up Instructions Return for FASTING labs and Physical.     I discussed the assessment and treatment plan with the patient. The patient was provided an opportunity to ask questions and all were answered. The patient agreed with the plan and demonstrated an understanding of the instructions.   The patient was advised to call back or seek an in-person evaluation if the symptoms worsen or if the condition fails to improve as anticipated.  I provided 22 minutes of non-face-to-face time during this encounter including median intraservice time, reviewing previous notes,  labs, imaging, medications and explaining diagnosis and management.  Gildardo Pounds, FNP-BC

## 2019-04-27 DIAGNOSIS — F411 Generalized anxiety disorder: Secondary | ICD-10-CM | POA: Diagnosis not present

## 2019-04-27 DIAGNOSIS — F322 Major depressive disorder, single episode, severe without psychotic features: Secondary | ICD-10-CM | POA: Diagnosis not present

## 2019-05-30 ENCOUNTER — Ambulatory Visit: Payer: Medicaid Other | Admitting: Nurse Practitioner

## 2019-06-21 ENCOUNTER — Ambulatory Visit: Payer: Medicaid Other | Admitting: Nurse Practitioner

## 2019-08-16 ENCOUNTER — Ambulatory Visit: Payer: Medicaid Other | Attending: Nurse Practitioner | Admitting: Nurse Practitioner

## 2019-08-16 ENCOUNTER — Other Ambulatory Visit: Payer: Self-pay

## 2019-08-16 ENCOUNTER — Encounter: Payer: Self-pay | Admitting: Nurse Practitioner

## 2019-08-16 VITALS — BP 95/61 | HR 62 | Temp 98.3°F | Ht 66.5 in | Wt 144.0 lb

## 2019-08-16 DIAGNOSIS — Z0000 Encounter for general adult medical examination without abnormal findings: Secondary | ICD-10-CM | POA: Diagnosis not present

## 2019-08-16 DIAGNOSIS — F419 Anxiety disorder, unspecified: Secondary | ICD-10-CM | POA: Diagnosis not present

## 2019-08-16 DIAGNOSIS — G4700 Insomnia, unspecified: Secondary | ICD-10-CM | POA: Diagnosis not present

## 2019-08-16 DIAGNOSIS — Z79899 Other long term (current) drug therapy: Secondary | ICD-10-CM | POA: Insufficient documentation

## 2019-08-16 DIAGNOSIS — Z818 Family history of other mental and behavioral disorders: Secondary | ICD-10-CM | POA: Insufficient documentation

## 2019-08-16 DIAGNOSIS — F329 Major depressive disorder, single episode, unspecified: Secondary | ICD-10-CM | POA: Diagnosis not present

## 2019-08-16 MED ORDER — HYDROXYZINE HCL 25 MG PO TABS: 25.0000 mg | ORAL_TABLET | Freq: Three times a day (TID) | ORAL | 1 refills | Status: DC | PRN

## 2019-08-16 MED ORDER — SERTRALINE HCL 50 MG PO TABS: 50.0000 mg | ORAL_TABLET | Freq: Every day | ORAL | 0 refills | Status: DC

## 2019-08-16 NOTE — Progress Notes (Signed)
Assessment & Plan:  Cody Kelley was seen today for annual exam.  Diagnoses and all orders for this visit:  Encounter for annual physical exam  Anxiety and depression -     sertraline (ZOLOFT) 50 MG tablet; Take 1 tablet (50 mg total) by mouth daily. -     hydrOXYzine (ATARAX/VISTARIL) 25 MG tablet; Take 1 tablet (25 mg total) by mouth 3 (three) times daily as needed for anxiety.    Patient has been counseled on age-appropriate routine health concerns for screening and prevention. These are reviewed and up-to-date. Referrals have been placed accordingly. Immunizations are up-to-date or declined.    Subjective:   Chief Complaint  Patient presents with  . Annual Exam    Pt. is here for physical.    HPI Cody Kelley 18 y.o. male presents to office today for annual physical   Depression and Anxiety Patient complains of depression. He complains of insomnia,  depressed mood and difficulty concentrating. Onset was approximately several months ago, unchanged since that time.  He denies current suicidal and homicidal plan or intent.    Previous treatment includes trazodone, hydroxyzine and sertraline  Review of Systems  Constitutional: Negative for fever, malaise/fatigue and weight loss.  HENT: Negative.  Negative for nosebleeds.   Eyes: Negative.  Negative for blurred vision, double vision and photophobia.  Respiratory: Negative.  Negative for cough and shortness of breath.   Cardiovascular: Negative.  Negative for chest pain, palpitations and leg swelling.  Gastrointestinal: Negative.  Negative for heartburn, nausea and vomiting.  Genitourinary: Negative.   Musculoskeletal: Negative.  Negative for myalgias.  Skin: Negative.   Neurological: Negative.  Negative for dizziness, focal weakness, seizures and headaches.  Endo/Heme/Allergies: Negative.   Psychiatric/Behavioral: Positive for depression. Negative for suicidal ideas. The patient is nervous/anxious and has insomnia.     Past  Medical History:  Diagnosis Date  . Anxiety   . Broken arm   . Depression     Past Surgical History:  Procedure Laterality Date  . NO PAST SURGERIES      Family History  Problem Relation Age of Onset  . Schizophrenia Paternal Uncle   . Diabetes Neg Hx   . Hypertension Neg Hx     Social History Reviewed with no changes to be made today.   Outpatient Medications Prior to Visit  Medication Sig Dispense Refill  . hydrOXYzine (ATARAX/VISTARIL) 25 MG tablet Take 1 tablet (25 mg total) by mouth 3 (three) times daily as needed for anxiety. 30 tablet 0  . sertraline (ZOLOFT) 50 MG tablet Take 1 tablet (50 mg total) by mouth daily. 30 tablet 0  . traZODone (DESYREL) 50 MG tablet Take 1 tablet (50 mg total) by mouth at bedtime as needed for sleep. (Patient not taking: Reported on 08/16/2019) 30 tablet 0   No facility-administered medications prior to visit.    No Known Allergies     Objective:    BP 95/61 (BP Location: Left Arm, Patient Position: Sitting, Cuff Size: Normal)   Pulse 62   Temp 98.3 F (36.8 C) (Oral)   Ht 5' 6.5" (1.689 m)   Wt 144 lb (65.3 kg)   SpO2 (!) 62%   BMI 22.89 kg/m  Wt Readings from Last 3 Encounters:  08/16/19 144 lb (65.3 kg)  05/11/11 67 lb 14.4 oz (30.8 kg) (5 %, Z= -1.64)*   * Growth percentiles are based on CDC (Boys, 2-20 Years) data.    Physical Exam Constitutional:      Appearance:  He is well-developed.  HENT:     Head: Normocephalic and atraumatic.     Right Ear: Hearing, tympanic membrane, ear canal and external ear normal.     Left Ear: Hearing, tympanic membrane, ear canal and external ear normal.     Nose: Nose normal. No mucosal edema or rhinorrhea.     Mouth/Throat:     Pharynx: Uvula midline.     Tonsils: No tonsillar exudate. 1+ on the right. 1+ on the left.  Eyes:     General: Lids are normal. No scleral icterus.    Conjunctiva/sclera: Conjunctivae normal.     Pupils: Pupils are equal, round, and reactive to light.    Neck:     Thyroid: No thyromegaly.     Trachea: No tracheal deviation.  Cardiovascular:     Rate and Rhythm: Normal rate and regular rhythm.     Heart sounds: Normal heart sounds. No murmur. No friction rub. No gallop.   Pulmonary:     Effort: Pulmonary effort is normal. No respiratory distress.     Breath sounds: Normal breath sounds. No wheezing or rales.  Chest:     Chest wall: No mass or tenderness.     Breasts:        Right: No inverted nipple, mass, nipple discharge, skin change or tenderness.        Left: No inverted nipple, mass, nipple discharge, skin change or tenderness.  Abdominal:     General: Bowel sounds are normal. There is no distension.     Palpations: Abdomen is soft. There is no mass.     Tenderness: There is no abdominal tenderness. There is no guarding or rebound.     Hernia: There is no hernia in the left inguinal area.  Genitourinary:    Penis: Normal.      Testes: Normal.        Right: Mass, tenderness or swelling not present. Right testis is descended. Cremasteric reflex is present.         Left: Mass, tenderness or swelling not present. Left testis is descended. Cremasteric reflex is present.   Musculoskeletal:        General: No tenderness or deformity. Normal range of motion.     Cervical back: Normal range of motion and neck supple.  Lymphadenopathy:     Cervical: No cervical adenopathy.     Lower Body: No right inguinal adenopathy. No left inguinal adenopathy.  Skin:    General: Skin is warm and dry.     Capillary Refill: Capillary refill takes less than 2 seconds.     Findings: No erythema.  Neurological:     General: No focal deficit present.     Mental Status: He is alert and oriented to person, place, and time. Mental status is at baseline.     Cranial Nerves: No cranial nerve deficit.     Sensory: No sensory deficit.     Motor: No weakness or abnormal muscle tone.     Coordination: Coordination normal.     Gait: Gait normal.     Deep  Tendon Reflexes: Reflexes normal.  Psychiatric:        Behavior: Behavior normal.        Thought Content: Thought content normal.        Judgment: Judgment normal.          Patient has been counseled extensively about nutrition and exercise as well as the importance of adherence with medications and regular follow-up. The patient was  given clear instructions to go to ER or return to medical center if symptoms don't improve, worsen or new problems develop. The patient verbalized understanding.   Follow-up: Return in 4 weeks (on 09/13/2019), or if symptoms worsen or fail to improve, for Depression and Anxiety.   Claiborne Rigg, FNP-BC [facility] [place], [place] [phone]   08/28/2019, 8:55 PM

## 2019-10-28 ENCOUNTER — Telehealth: Payer: Self-pay | Admitting: Nurse Practitioner

## 2019-10-28 ENCOUNTER — Other Ambulatory Visit: Payer: Self-pay | Admitting: Nurse Practitioner

## 2019-10-28 DIAGNOSIS — F419 Anxiety disorder, unspecified: Secondary | ICD-10-CM

## 2019-10-28 DIAGNOSIS — F329 Major depressive disorder, single episode, unspecified: Secondary | ICD-10-CM

## 2019-10-28 MED ORDER — SERTRALINE HCL 50 MG PO TABS: 50.0000 mg | ORAL_TABLET | Freq: Every day | ORAL | 2 refills | Status: DC

## 2019-10-28 NOTE — Telephone Encounter (Signed)
Patients grandmother called and requested for listed medication to be refilled. Please send to [facility] [place]. SERTRALINE 50mg 

## 2019-10-28 NOTE — Telephone Encounter (Signed)
Will route to PCP 

## 2019-12-02 ENCOUNTER — Ambulatory Visit: Payer: Medicaid Other | Attending: Internal Medicine

## 2019-12-02 DIAGNOSIS — Z20822 Contact with and (suspected) exposure to covid-19: Secondary | ICD-10-CM

## 2019-12-03 LAB — NOVEL CORONAVIRUS, NAA: SARS-CoV-2, NAA: NOT DETECTED

## 2019-12-03 LAB — SARS-COV-2, NAA 2 DAY TAT

## 2020-01-06 ENCOUNTER — Other Ambulatory Visit: Payer: Self-pay | Admitting: Nurse Practitioner

## 2020-01-06 DIAGNOSIS — F32A Depression, unspecified: Secondary | ICD-10-CM

## 2020-01-06 DIAGNOSIS — F419 Anxiety disorder, unspecified: Secondary | ICD-10-CM

## 2020-09-03 DIAGNOSIS — Z20828 Contact with and (suspected) exposure to other viral communicable diseases: Secondary | ICD-10-CM | POA: Diagnosis not present

## 2020-12-16 ENCOUNTER — Emergency Department (HOSPITAL_BASED_OUTPATIENT_CLINIC_OR_DEPARTMENT_OTHER)
Admission: EM | Admit: 2020-12-16 | Discharge: 2020-12-16 | Disposition: A | Discharge status: Home / Self Care | Payer: Medicaid Other | Attending: Emergency Medicine | Admitting: Emergency Medicine

## 2020-12-16 ENCOUNTER — Emergency Department (HOSPITAL_BASED_OUTPATIENT_CLINIC_OR_DEPARTMENT_OTHER): Payer: Medicaid Other | Admitting: Radiology

## 2020-12-16 ENCOUNTER — Other Ambulatory Visit: Payer: Self-pay

## 2020-12-16 ENCOUNTER — Encounter (HOSPITAL_BASED_OUTPATIENT_CLINIC_OR_DEPARTMENT_OTHER): Payer: Self-pay | Admitting: Emergency Medicine

## 2020-12-16 DIAGNOSIS — S62324A Displaced fracture of shaft of fourth metacarpal bone, right hand, initial encounter for closed fracture: Secondary | ICD-10-CM | POA: Diagnosis not present

## 2020-12-16 DIAGNOSIS — W228XXA Striking against or struck by other objects, initial encounter: Secondary | ICD-10-CM | POA: Insufficient documentation

## 2020-12-16 DIAGNOSIS — S6991XA Unspecified injury of right wrist, hand and finger(s), initial encounter: Secondary | ICD-10-CM | POA: Diagnosis present

## 2020-12-16 DIAGNOSIS — S62314A Displaced fracture of base of fourth metacarpal bone, right hand, initial encounter for closed fracture: Secondary | ICD-10-CM | POA: Diagnosis not present

## 2020-12-16 NOTE — ED Provider Notes (Signed)
[facility] EMERGENCY DEPT Provider Note   CSN: [id number] Arrival date & time: 12/16/20  1354     History Chief Complaint  Patient presents with  . Hand Injury    Cody Kelley is a 20 y.o. male.  The history is provided by the patient.  Hand Injury Location:  Hand Hand location:  R hand Injury: yes   Time since incident:  2 days Mechanism of injury comment:  Punched a door Pain details:    Quality:  Throbbing   Radiates to:  Does not radiate   Severity:  Moderate   Onset quality:  Sudden   Duration:  2 days   Timing:  Constant   Progression:  Unchanged Handedness:  Right-handed Dislocation: no   Foreign body present:  No foreign bodies Tetanus status:  Up to date Prior injury to area:  No Relieved by:  Being still Worsened by:  Movement Ineffective treatments:  Ice Associated symptoms: swelling and tingling   Associated symptoms: no back pain, no decreased range of motion and no fever        Past Medical History:  Diagnosis Date  . Anxiety   . Broken arm   . Depression     Patient Active Problem List   Diagnosis Date Noted  . MDD (major depressive disorder), recurrent severe, without psychosis (HCC) 11/06/2018    Past Surgical History:  Procedure Laterality Date  . NO PAST SURGERIES         Family History  Problem Relation Age of Onset  . Schizophrenia Paternal Uncle   . Diabetes Neg Hx   . Hypertension Neg Hx     Social History   Tobacco Use  . Smoking status: Never Smoker  . Smokeless tobacco: Never Used  Vaping Use  . Vaping Use: Every day  Substance Use Topics  . Alcohol use: Yes  . Drug use: Yes    Types: Marijuana    Comment: daily    Home Medications Prior to Admission medications   Medication Sig Start Date End Date Taking? Authorizing Provider  hydrOXYzine (ATARAX/VISTARIL) 25 MG tablet TAKE 1 TABLET(25 MG) BY MOUTH THREE TIMES DAILY AS NEEDED FOR ANXIETY 01/06/20  Yes Newlin, Enobong, MD  sertraline  (ZOLOFT) 50 MG tablet Take 1 tablet (50 mg total) by mouth daily. 10/28/19  Yes Claiborne Rigg, NP    Allergies    Patient has no known allergies.  Review of Systems   Review of Systems  Constitutional: Negative for chills and fever.  HENT: Negative for ear pain and sore throat.   Eyes: Negative for pain and visual disturbance.  Respiratory: Negative for cough and shortness of breath.   Cardiovascular: Negative for chest pain and palpitations.  Gastrointestinal: Negative for abdominal pain and vomiting.  Genitourinary: Negative for dysuria and hematuria.  Musculoskeletal: Negative for arthralgias and back pain.  Skin: Positive for wound. Negative for color change and rash.  Neurological: Negative for seizures and syncope.  All other systems reviewed and are negative.   Physical Exam Updated Vital Signs BP 134/73 (BP Location: Right Arm)   Pulse 74   Temp 98.3 F (36.8 C) (Oral)   Resp 15   Ht 5\' 6"  (1.676 m)   Wt 61.2 kg   SpO2 98%   BMI 21.79 kg/m   Physical Exam Vitals and nursing note reviewed.  Constitutional:      Appearance: Normal appearance.  HENT:     Head: Normocephalic and atraumatic.  Eyes:  Conjunctiva/sclera: Conjunctivae normal.  Pulmonary:     Effort: Pulmonary effort is normal. No respiratory distress.  Musculoskeletal:        General: No deformity. Normal range of motion.     Cervical back: Normal range of motion.     Comments: The patient has abrasions over the fourth and fifth metacarpal heads on the dorsal aspect of the right hand.  He also has an abrasion over the proximal phalanx of the second finger on this hand.  He has tenderness to palpation over the dorsum of the fifth metacarpal.  Good capillary refill at his fingers.  Intact sensation.  Intact tendon function, but the patient does appear to have slight rotational deformity of his fifth finger.  Skin:    General: Skin is warm and dry.  Neurological:     General: No focal deficit  present.     Mental Status: He is alert and oriented to person, place, and time. Mental status is at baseline.  Psychiatric:        Mood and Affect: Mood normal.     ED Results / Procedures / Treatments   Labs (all labs ordered are listed, but only abnormal results are displayed) Labs Reviewed - No data to display  EKG None  Radiology DG Hand Complete Right  Result Date: 12/16/2020 CLINICAL DATA:  Patient punched a door 2 days ago.  Swelling. EXAM: RIGHT HAND - COMPLETE 3+ VIEW COMPARISON:  None. FINDINGS: There is a mildly displaced and angulated fracture in the mid shaft of the fourth metacarpal. No additional acute osseous abnormality. No dislocation. There is dorsal soft tissue swelling. IMPRESSION: Mildly displaced and angulated fracture in the mid shaft of the fourth metacarpal. Electronically Signed   By: Emmaline Kluver M.D.   On: 12/16/2020 14:51    Procedures Procedures   Medications Ordered in ED Medications - No data to display  ED Course  I have reviewed the triage vital signs and the nursing notes.  Pertinent labs & imaging results that were available during my care of the patient were reviewed by me and considered in my medical decision making (see chart for details).    MDM Rules/Calculators/A&P                          Cody Kelley punched a wall several days ago and sustained a fracture of his fourth metacarpal.  He does have some skin wounds which have been covered.  He was placed in an ulnar gutter splint and referred to Ortho for follow-up.  Pain control is adequate with over-the-counter medication. Final Clinical Impression(s) / ED Diagnoses Final diagnoses:  Closed displaced fracture of base of fourth metacarpal bone of right hand, initial encounter    Rx / DC Orders ED Discharge Orders    None       Koleen Distance, MD 12/16/20 1456

## 2020-12-16 NOTE — ED Triage Notes (Signed)
Pt punched a door two days ago and comes to the Ed due to swelling

## 2020-12-21 DIAGNOSIS — M79641 Pain in right hand: Secondary | ICD-10-CM | POA: Diagnosis not present

## 2020-12-25 ENCOUNTER — Other Ambulatory Visit: Payer: Self-pay | Admitting: Orthopedic Surgery

## 2020-12-25 ENCOUNTER — Encounter (HOSPITAL_BASED_OUTPATIENT_CLINIC_OR_DEPARTMENT_OTHER): Payer: Self-pay | Admitting: Orthopedic Surgery

## 2020-12-25 ENCOUNTER — Other Ambulatory Visit: Payer: Self-pay

## 2020-12-25 DIAGNOSIS — S63054A Dislocation of other carpometacarpal joint of right hand, initial encounter: Secondary | ICD-10-CM | POA: Diagnosis not present

## 2020-12-25 DIAGNOSIS — S62324A Displaced fracture of shaft of fourth metacarpal bone, right hand, initial encounter for closed fracture: Secondary | ICD-10-CM | POA: Diagnosis not present

## 2020-12-26 ENCOUNTER — Other Ambulatory Visit (HOSPITAL_COMMUNITY)
Admission: RE | Admit: 2020-12-26 | Discharge: 2020-12-26 | Disposition: A | Discharge status: Home / Self Care | Payer: Medicaid Other | Source: Ambulatory Visit | Attending: Orthopedic Surgery | Admitting: Orthopedic Surgery

## 2020-12-26 DIAGNOSIS — Z20822 Contact with and (suspected) exposure to covid-19: Secondary | ICD-10-CM | POA: Diagnosis not present

## 2020-12-26 DIAGNOSIS — Z01812 Encounter for preprocedural laboratory examination: Secondary | ICD-10-CM | POA: Diagnosis not present

## 2020-12-26 DIAGNOSIS — S63054A Dislocation of other carpometacarpal joint of right hand, initial encounter: Secondary | ICD-10-CM | POA: Diagnosis not present

## 2020-12-26 DIAGNOSIS — S62324A Displaced fracture of shaft of fourth metacarpal bone, right hand, initial encounter for closed fracture: Secondary | ICD-10-CM | POA: Diagnosis not present

## 2020-12-26 LAB — SARS CORONAVIRUS 2 (TAT 6-24 HRS): SARS Coronavirus 2: NEGATIVE

## 2020-12-27 ENCOUNTER — Other Ambulatory Visit: Payer: Self-pay

## 2020-12-27 ENCOUNTER — Encounter (HOSPITAL_COMMUNITY): Payer: Self-pay | Admitting: Orthopedic Surgery

## 2020-12-27 NOTE — H&P (Signed)
Primary Care Provider: None Referring Provider: [facility] Worker's Comp: No Date of Injury or Onset: 12-16-20  History: CC / Reason for Visit: Right hand injury HPI: This patient is a 20 year old RHD male who presents for evaluation of his right hand.  He apparently punched a wall and was evaluated in the emergency department at [facility].  He was placed into a splint and instructed to follow-up with Dr. Ave Filter.  He was evaluated by Dr. Ave Filter and his PA on 12-21-20, noted to have a displaced angulated right fourth metacarpal shaft fracture, and presents for further evaluation.  Past medical history, past surgical history, family history, social history, medications, allergies and review of systems are thoroughly reviewed by me, signed and scanned into SRS today.    Exam:  Vitals: Refer to EMR. Constitutional:  WD, WN, NAD HEENT:  NCAT, EOMI Neuro/Psych:  Alert & oriented to person, place, and time; appropriate mood & affect Lymphatic: No generalized UE edema or lymphadenopathy Extremities / MSK:  Both UE are normal with respect to appearance, ranges of motion, joint stability, muscle strength/tone, sensation, & perfusion except as otherwise noted:  The hand has nearly full flexion extension without malrotation.  There is a prominence dorsally at the fifth [facility] joint, and a couple small abrasions dorsally overlying the fourth and fifth CMC joints.  Labs / Xrays:  3 views of the right hand ordered and obtained today reveals a midshaft fracture of the fourth metacarpal with significant palmar angulation, but also dorsally dislocated fifth CMC joint  Assessment: 1.  Right fifth CMC closed dislocation 2.  Right fourth metacarpal shaft fracture, displaced  Plan:  I discussed these findings with him.  With gentle longitudinal traction and manipulative reduction, the [facility] joint is reduced, but is unstable.  It tends to stay reduced with the wrist slightly extended and a new splint is applied today  with it in this position.  We discussed the indications for surgical stabilization of this fifth CMC dislocation, as well as taking the opportunity to address the fourth metacarpal fracture in the same setting, hopefully with intramedullary nailing.  We will plan to proceed on Friday.  The details of the operative procedure were discussed with the patient.  Questions were invited and answered.  In addition to the goal of the procedure, the risks of the procedure to include but not limited to bleeding; infection; damage to the nerves or blood vessels that could result in bleeding, numbness, weakness, chronic pain, and the need for additional procedures; stiffness; the need for revision surgery; and anesthetic risks were reviewed.  No specific outcome was guaranteed or implied.  Informed consent was obtained.  Autoauthenticated,  Cliffton Asters. Janee Morn, MD  CC: None

## 2020-12-28 ENCOUNTER — Ambulatory Visit (HOSPITAL_COMMUNITY): Payer: Medicaid Other

## 2020-12-28 ENCOUNTER — Other Ambulatory Visit: Payer: Self-pay

## 2020-12-28 ENCOUNTER — Ambulatory Visit (HOSPITAL_COMMUNITY)
Admission: RE | Admit: 2020-12-28 | Discharge: 2020-12-28 | Disposition: A | Discharge status: Home / Self Care | Payer: Medicaid Other | Attending: Orthopedic Surgery | Admitting: Orthopedic Surgery

## 2020-12-28 ENCOUNTER — Encounter (HOSPITAL_COMMUNITY): Payer: Self-pay | Admitting: Orthopedic Surgery

## 2020-12-28 ENCOUNTER — Ambulatory Visit (HOSPITAL_COMMUNITY): Payer: Medicaid Other | Admitting: Certified Registered"

## 2020-12-28 ENCOUNTER — Encounter (HOSPITAL_COMMUNITY)
Admission: RE | Disposition: A | Discharge status: Home / Self Care | Payer: Self-pay | Source: Home / Self Care | Attending: Orthopedic Surgery

## 2020-12-28 DIAGNOSIS — W2209XA Striking against other stationary object, initial encounter: Secondary | ICD-10-CM | POA: Insufficient documentation

## 2020-12-28 DIAGNOSIS — Z419 Encounter for procedure for purposes other than remedying health state, unspecified: Secondary | ICD-10-CM

## 2020-12-28 DIAGNOSIS — S62304A Unspecified fracture of fourth metacarpal bone, right hand, initial encounter for closed fracture: Secondary | ICD-10-CM | POA: Diagnosis not present

## 2020-12-28 DIAGNOSIS — F332 Major depressive disorder, recurrent severe without psychotic features: Secondary | ICD-10-CM | POA: Diagnosis not present

## 2020-12-28 DIAGNOSIS — S62324A Displaced fracture of shaft of fourth metacarpal bone, right hand, initial encounter for closed fracture: Secondary | ICD-10-CM | POA: Diagnosis not present

## 2020-12-28 DIAGNOSIS — S63054A Dislocation of other carpometacarpal joint of right hand, initial encounter: Secondary | ICD-10-CM | POA: Insufficient documentation

## 2020-12-28 DIAGNOSIS — S62306D Unspecified fracture of fifth metacarpal bone, right hand, subsequent encounter for fracture with routine healing: Secondary | ICD-10-CM | POA: Diagnosis not present

## 2020-12-28 DIAGNOSIS — G43909 Migraine, unspecified, not intractable, without status migrainosus: Secondary | ICD-10-CM | POA: Diagnosis not present

## 2020-12-28 HISTORY — DX: Headache, unspecified: R51.9

## 2020-12-28 HISTORY — DX: Unspecified fracture of right wrist and hand, initial encounter for closed fracture: S62.91XA

## 2020-12-28 HISTORY — DX: Gastro-esophageal reflux disease without esophagitis: K21.9

## 2020-12-28 HISTORY — PX: OPEN REDUCTION INTERNAL FIXATION (ORIF) METACARPAL: SHX6234

## 2020-12-28 SURGERY — OPEN REDUCTION INTERNAL FIXATION (ORIF) METACARPAL
Anesthesia: Monitor Anesthesia Care | Site: Finger | Laterality: Right

## 2020-12-28 MED ORDER — LACTATED RINGERS IV SOLN: INTRAVENOUS | Status: DC

## 2020-12-28 MED ORDER — FENTANYL CITRATE (PF) 100 MCG/2ML IJ SOLN: 100.0000 ug | Freq: Once | INTRAMUSCULAR | Status: AC

## 2020-12-28 MED ORDER — CEFAZOLIN SODIUM-DEXTROSE 2-4 GM/100ML-% IV SOLN
2.0000 g | INTRAVENOUS | Status: AC
  Administered 2020-12-28: 2 g via INTRAVENOUS
  Filled 2020-12-28: qty 100

## 2020-12-28 MED ORDER — BUPIVACAINE-EPINEPHRINE 0.5% -1:200000 IJ SOLN
INTRAMUSCULAR | Status: AC
  Filled 2020-12-28: qty 1

## 2020-12-28 MED ORDER — ONDANSETRON HCL 4 MG/2ML IJ SOLN
INTRAMUSCULAR | Status: DC | PRN
  Administered 2020-12-28: 4 mg via INTRAVENOUS

## 2020-12-28 MED ORDER — LIDOCAINE HCL (PF) 1 % IJ SOLN
INTRAMUSCULAR | Status: AC
  Filled 2020-12-28: qty 30

## 2020-12-28 MED ORDER — BUPIVACAINE-EPINEPHRINE (PF) 0.25% -1:200000 IJ SOLN
INTRAMUSCULAR | Status: AC
  Filled 2020-12-28: qty 30

## 2020-12-28 MED ORDER — LIDOCAINE 2% (20 MG/ML) 5 ML SYRINGE
INTRAMUSCULAR | Status: DC | PRN
  Administered 2020-12-28: 40 mg via INTRAVENOUS

## 2020-12-28 MED ORDER — PROPOFOL 500 MG/50ML IV EMUL
INTRAVENOUS | Status: DC | PRN
  Administered 2020-12-28: 150 ug/kg/min via INTRAVENOUS

## 2020-12-28 MED ORDER — OXYCODONE HCL 5 MG PO TABS: 5.0000 mg | ORAL_TABLET | Freq: Four times a day (QID) | ORAL | 0 refills | Status: DC | PRN

## 2020-12-28 MED ORDER — MIDAZOLAM HCL 2 MG/2ML IJ SOLN
INTRAMUSCULAR | Status: AC
  Administered 2020-12-28: 2 mg via INTRAVENOUS
  Filled 2020-12-28: qty 2

## 2020-12-28 MED ORDER — FENTANYL CITRATE (PF) 100 MCG/2ML IJ SOLN
INTRAMUSCULAR | Status: AC
  Administered 2020-12-28: 100 ug via INTRAVENOUS
  Filled 2020-12-28: qty 2

## 2020-12-28 MED ORDER — 0.9 % SODIUM CHLORIDE (POUR BTL) OPTIME
TOPICAL | Status: DC | PRN
  Administered 2020-12-28: 1000 mL

## 2020-12-28 MED ORDER — CHLORHEXIDINE GLUCONATE 0.12 % MT SOLN
OROMUCOSAL | Status: AC
  Administered 2020-12-28: 15 mL
  Filled 2020-12-28: qty 15

## 2020-12-28 MED ORDER — ACETAMINOPHEN 325 MG PO TABS: 650.0000 mg | ORAL_TABLET | Freq: Four times a day (QID) | ORAL | Status: DC

## 2020-12-28 MED ORDER — IBUPROFEN 200 MG PO TABS: 600.0000 mg | ORAL_TABLET | Freq: Four times a day (QID) | ORAL | Status: DC

## 2020-12-28 MED ORDER — MIDAZOLAM HCL 2 MG/2ML IJ SOLN: 2.0000 mg | Freq: Once | INTRAMUSCULAR | Status: AC

## 2020-12-28 SURGICAL SUPPLY — 46 items
APL PRP STRL LF DISP 70% ISPRP (MISCELLANEOUS) ×1
BLADE MINI RND TIP GREEN BEAV (BLADE) IMPLANT
BNDG CMPR 9X4 STRL LF SNTH (GAUZE/BANDAGES/DRESSINGS) ×1
BNDG COHESIVE 4X5 TAN STRL (GAUZE/BANDAGES/DRESSINGS) ×2 IMPLANT
BNDG ESMARK 4X9 LF (GAUZE/BANDAGES/DRESSINGS) ×2 IMPLANT
BNDG GAUZE ELAST 4 BULKY (GAUZE/BANDAGES/DRESSINGS) ×2 IMPLANT
CANISTER SUCT 3000ML PPV (MISCELLANEOUS) IMPLANT
CHLORAPREP W/TINT 26 (MISCELLANEOUS) ×2 IMPLANT
CORD BIPOLAR FORCEPS 12FT (ELECTRODE) ×2 IMPLANT
COVER MAYO STAND STRL (DRAPES) IMPLANT
COVER WAND RF STERILE (DRAPES) ×2 IMPLANT
CUFF TOURN SGL QUICK 18X4 (TOURNIQUET CUFF) IMPLANT
DRAPE C-ARM 42X72 X-RAY (DRAPES) ×2 IMPLANT
DRAPE SURG 17X23 STRL (DRAPES) ×2 IMPLANT
DRSG EMULSION OIL 3X3 NADH (GAUZE/BANDAGES/DRESSINGS) ×2 IMPLANT
GAUZE SPONGE 4X4 12PLY STRL (GAUZE/BANDAGES/DRESSINGS) ×1 IMPLANT
GAUZE SPONGE 4X4 12PLY STRL LF (GAUZE/BANDAGES/DRESSINGS) ×2 IMPLANT
GLOVE BIO SURGEON STRL SZ7.5 (GLOVE) ×2 IMPLANT
GLOVE ECLIPSE 6.5 STRL STRAW (GLOVE) ×2 IMPLANT
GLOVE SRG 8 PF TXTR STRL LF DI (GLOVE) ×1 IMPLANT
GLOVE SURG UNDER POLY LF SZ7 (GLOVE) ×2 IMPLANT
GLOVE SURG UNDER POLY LF SZ8 (GLOVE) ×2
GOWN STRL REUS W/ TWL LRG LVL3 (GOWN DISPOSABLE) ×2 IMPLANT
GOWN STRL REUS W/ TWL XL LVL3 (GOWN DISPOSABLE) ×1 IMPLANT
GOWN STRL REUS W/TWL LRG LVL3 (GOWN DISPOSABLE) ×4
GOWN STRL REUS W/TWL XL LVL3 (GOWN DISPOSABLE) ×2
K-WIRE DBL END TROCAR 6X.045 (WIRE) ×4
K-WIRE DBL END TROCAR 6X.062 (WIRE) ×2
KIT BASIN OR (CUSTOM PROCEDURE TRAY) ×2 IMPLANT
KWIRE DBL END TROCAR 6X.045 (WIRE) IMPLANT
KWIRE DBL END TROCAR 6X.062 (WIRE) IMPLANT
NDL HYPO 25X1 1.5 SAFETY (NEEDLE) IMPLANT
NEEDLE HYPO 25X1 1.5 SAFETY (NEEDLE) IMPLANT
NS IRRIG 1000ML POUR BTL (IV SOLUTION) ×2 IMPLANT
PACK ORTHO EXTREMITY (CUSTOM PROCEDURE TRAY) ×2 IMPLANT
PAD CAST 4YDX4 CTTN HI CHSV (CAST SUPPLIES) IMPLANT
PADDING CAST COTTON 4X4 STRL (CAST SUPPLIES) ×2
SLING ARM FOAM STRAP LRG (SOFTGOODS) ×1 IMPLANT
STOCKINETTE 6  STRL (DRAPES) ×2
STOCKINETTE 6 STRL (DRAPES) ×1 IMPLANT
SUT VICRYL RAPIDE 4/0 PS 2 (SUTURE) ×1 IMPLANT
SYR 10ML LL (SYRINGE) IMPLANT
TOWEL GREEN STERILE FF (TOWEL DISPOSABLE) ×2 IMPLANT
TOWEL OR NON WOVEN STRL DISP B (DISPOSABLE) IMPLANT
TUBE CONNECTING 20X1/4 (TUBING) IMPLANT
UNDERPAD 30X36 HEAVY ABSORB (UNDERPADS AND DIAPERS) ×2 IMPLANT

## 2020-12-28 NOTE — Anesthesia Preprocedure Evaluation (Signed)
Anesthesia Evaluation  Patient identified by MRN, date of birth, ID band Patient awake    Reviewed: Allergy & Precautions, NPO status , Patient's Chart, lab work & pertinent test results  Airway Mallampati: I  TM Distance: >3 FB Neck ROM: Full    Dental  (+) Teeth Intact, Dental Advisory Given   Pulmonary    breath sounds clear to auscultation       Cardiovascular  Rhythm:Regular Rate:Normal     Neuro/Psych    GI/Hepatic   Endo/Other    Renal/GU      Musculoskeletal   Abdominal   Peds  Hematology   Anesthesia Other Findings   Reproductive/Obstetrics                             Anesthesia Physical Anesthesia Plan  ASA: I  Anesthesia Plan: MAC   Post-op Pain Management:  Regional for Post-op pain   Induction: Intravenous  PONV Risk Score and Plan: Ondansetron and Propofol infusion  Airway Management Planned: Natural Airway and Simple Face Mask  Additional Equipment:   Intra-op Plan:   Post-operative Plan:   Informed Consent: I have reviewed the patients History and Physical, chart, labs and discussed the procedure including the risks, benefits and alternatives for the proposed anesthesia with the patient or authorized representative who has indicated his/her understanding and acceptance.     Dental advisory given  Plan Discussed with: CRNA and Anesthesiologist  Anesthesia Plan Comments: (Plan MAC with supraclavicular block)        Anesthesia Quick Evaluation

## 2020-12-28 NOTE — Anesthesia Postprocedure Evaluation (Signed)
Anesthesia Post Note  Patient: Cody Kelley  Procedure(s) Performed: OPEN TREATMENT RIGHT 4TH METACARPAL FRACTURE AND PINNING 5TH CARPOMETACARPAL DISLOCATION (Right Finger)     Patient location during evaluation: PACU Anesthesia Type: MAC Level of consciousness: awake and alert Pain management: pain level controlled Vital Signs Assessment: post-procedure vital signs reviewed and stable Respiratory status: spontaneous breathing, nonlabored ventilation, respiratory function stable and patient connected to nasal cannula oxygen Cardiovascular status: stable and blood pressure returned to baseline Postop Assessment: no apparent nausea or vomiting Anesthetic complications: no   No complications documented.  Last Vitals:  Vitals:   12/28/20 1119 12/28/20 1135  BP: 133/64 (!) 111/50  Pulse: (!) 41 (!) 55  Resp: 12 15  Temp: (!) 36.2 C 36.7 C  SpO2: 100% 100%    Last Pain:  Vitals:   12/28/20 1135  TempSrc:   PainSc: 0-No pain                 Johanna Stafford COKER

## 2020-12-28 NOTE — Interval H&P Note (Signed)
History and Physical Interval Note:  12/28/2020 10:13 AM  Cody Kelley  has presented today for surgery, with the diagnosis of RIGHT 4TH METACARPAL FRACTURE AND 5TH CARPOMETACARPAL DISLOCATION.  The various methods of treatment have been discussed with the patient and family. After consideration of risks, benefits and other options for treatment, the patient has consented to  Procedure(s) with comments: OPEN TREATMENT RIGHT 4TH METACARPAL FRACTURE AND PINNING 5TH CARPOMETACARPAL DISLOCATION (Right) - PRE-OP BLOCK as a surgical intervention.  The patient's history has been reviewed, patient examined, no change in status, stable for surgery.  I have reviewed the patient's chart and labs.  Questions were answered to the patient's satisfaction.     [name]

## 2020-12-28 NOTE — Op Note (Signed)
12/28/2020  11:16 AM  PATIENT:  Cody Kelley  20 y.o. male  PRE-OPERATIVE DIAGNOSIS:   1. Displaced R 4th MC fx      2. R 5th CMC dx  POST-OPERATIVE DIAGNOSIS:  Same  PROCEDURE:   1. ORIF R 4th MC shaft fx with placement of IMN    2. Pinning R 5th CMC dx  SURGEON: Iza Preston A. Grandville Silos, MD  PHYSICIAN ASSISTANT: Morley Kos, OPA-C  ANESTHESIA:  regional and MAC  SPECIMENS:  None  DRAINS:   None  EBL:  less than 50 mL  PREOPERATIVE INDICATIONS:  Cody Kelley is a  20 y.o. male with right hand injury after punching a wall.  The risks benefits and alternatives were discussed with the patient preoperatively including but not limited to the risks of infection, bleeding, nerve injury, cardiopulmonary complications, the need for revision surgery, among others, and the patient verbalized understanding and consented to proceed.  OPERATIVE IMPLANTS: 0.062 in kwire and 0.045 in kwires x 2  OPERATIVE PROCEDURE:  After receiving prophylactic antibiotics and a regional block, the patient was escorted to the operative theatre and placed in a supine position.  A surgical "time-out" was performed during which the planned procedure, proposed operative site, and the correct patient identity were compared to the operative consent and agreement confirmed by the circulating nurse according to current facility policy.  Following application of a tourniquet to the operative extremity, the exposed skin was prescrubbed with a Hibiclens scrub brush before being formally prepped with Chloraprep and draped in the usual sterile fashion.    Initially, the base of the fourth metacarpal was ascertained fluoroscopically and a small incision made over it on the dorsal aspect.  Spreading dissection was carried down to the bone and a perforation of the dorsal cortex made with a 0.062 inch K wire.  The K wire was then clipped of its sharp and, with the tip bent slightly, and it was inserted by hand as an  intramedullary nail.  Attempts at closed reduction were unsuccessful as the proximal fragment had some degree of butterfly comminution not originally appreciated were evident on x-ray.  For this reason, decision was made to proceed with open reduction.  The limb was exsanguinated with an Esmarch bandage and the tourniquet inflated to approximately 12mHg higher than systolic BP.  1 inch incision was made over the fracture in the dorsal aspect with spreading dissection carried down to the fourth metacarpal.  The ends of the bone were controlled with clamps and this allowed for direct visualization and reduction and passage of the nail across the fracture site and into the metacarpal head.  This was confirmed fluoroscopically.  The K wire was then bent over 90 degrees at its base, 90 again so that the remnant laid parallel to the skin surface.  It was clipped.  Attention was then shifted to the fifth CMC joint where the joint was held reduced and percutaneously pinned with 2 different 0.045 inch K wires, 1 from the fifth metacarpal base into the hamate and one from the fifth base into the fourth.  Adequacy of reduction and stabilization was confirmed fluoroscopically and final images obtained.  The digits were well aligned and without malrotation.  The second 2 K wires were then bent over 90 degrees of skin and clipped.  The tourniquet was released and the incision was closed with 4-0 Vicryl Rapide running dorsal mattress sutures.  A slightly bulky dressing was applied with a removable splint over top  and he was taken to the recovery room in stable condition.  DISPOSITION: He will be discharged home today with typical instructions, return in 10 to 15 days for reevaluation with new x-rays of the right hand out of splint and conversion to a short arm cast.  We will assess the need for any therapy for digital motion at that time.

## 2020-12-28 NOTE — Discharge Instructions (Signed)
Discharge Instructions   You have a dressing with a plaster splint incorporated in it. Move your fingers as much as possible, making a full fist and fully opening the fist. Elevate your hand to reduce pain & swelling of the digits.  Ice over the operative site may be helpful to reduce pain & swelling.  DO NOT USE HEAT. Pain medicine has been prescribed for you.  Take Tylenol 650 mg and Ibuprofen 600 mg every 6 hours for pain management. Take the prescribed pain medicine as a rescue medicine for severe post operative pain. Leave the dressing in place until you return to our office.  You may shower, but keep the bandage clean & dry.  You may drive a car when you are off of prescription pain medications and can safely control your vehicle with both hands. Call our office by Monday 12/31/20 to schedule a post operative appointment for 10-15 days from the date of surgery.   Please call [phone] during normal business hours or [phone] after hours for any problems. Including the following:  - excessive redness of the incisions - drainage for more than 4 days - fever of more than 101.5 F  *Please note that pain medications will not be refilled after hours or on weekends.

## 2020-12-28 NOTE — Progress Notes (Signed)
Orthopedic Tech Progress Note Patient Details:  Cody Kelley 12-01-2000 [id number]  Ortho Devices Type of Ortho Device: Velcro wrist forearm splint Ortho Device/Splint Interventions: Ordered       Cody Kelley 12/28/2020, 10:37 AM

## 2020-12-28 NOTE — Transfer of Care (Signed)
Immediate Anesthesia Transfer of Care Note  Patient: Cody Kelley  Procedure(s) Performed: OPEN TREATMENT RIGHT 4TH METACARPAL FRACTURE AND PINNING 5TH CARPOMETACARPAL DISLOCATION (Right Finger)  Patient Location: PACU  Anesthesia Type:MAC combined with regional for post-op pain  Level of Consciousness: awake, alert , oriented and patient cooperative  Airway & Oxygen Therapy: Patient Spontanous Breathing and Patient connected to nasal cannula oxygen  Post-op Assessment: Report given to RN, Post -op Vital signs reviewed and stable and Patient moving all extremities  Post vital signs: Reviewed and stable  Last Vitals:  Vitals Value Taken Time  BP 133/64 12/28/20 1119  Temp    Pulse 40 12/28/20 1121  Resp 16 12/28/20 1121  SpO2 100 % 12/28/20 1121  Vitals shown include unvalidated device data.  Last Pain:  Vitals:   12/28/20 0756  TempSrc: Oral  PainSc:          Complications: No complications documented.

## 2020-12-28 NOTE — Anesthesia Procedure Notes (Signed)
Anesthesia Regional Block: Supraclavicular block   Pre-Anesthetic Checklist: ,, timeout performed, Correct Patient, Correct Site, Correct Laterality, Correct Procedure, Correct Position, site marked, Risks and benefits discussed,  Surgical consent,  Pre-op evaluation,  At surgeon's request and post-op pain management  Laterality: Right  Prep: chloraprep       Needles:  Injection technique: Single-shot  Needle Type: Stimulator Needle - 80          Additional Needles:   Procedures:, nerve stimulator,,, ultrasound used (permanent image in chart),,,,  Narrative:  Start time: 12/28/2020 9:45 AM End time: 12/28/2020 9:50 AM Injection made incrementally with aspirations every 5 mL.  Performed by: Personally  Anesthesiologist: Kipp Brood, MD  Additional Notes: 20 cc 0.75% Ropicaine

## 2020-12-31 ENCOUNTER — Encounter (HOSPITAL_COMMUNITY): Payer: Self-pay | Admitting: Orthopedic Surgery

## 2021-01-10 DIAGNOSIS — S63054A Dislocation of other carpometacarpal joint of right hand, initial encounter: Secondary | ICD-10-CM | POA: Diagnosis not present

## 2021-01-10 DIAGNOSIS — S62324A Displaced fracture of shaft of fourth metacarpal bone, right hand, initial encounter for closed fracture: Secondary | ICD-10-CM | POA: Diagnosis not present

## 2021-02-14 DIAGNOSIS — S62324A Displaced fracture of shaft of fourth metacarpal bone, right hand, initial encounter for closed fracture: Secondary | ICD-10-CM | POA: Diagnosis not present

## 2021-02-14 DIAGNOSIS — S63054A Dislocation of other carpometacarpal joint of right hand, initial encounter: Secondary | ICD-10-CM | POA: Diagnosis not present

## 2021-03-19 DIAGNOSIS — S62324A Displaced fracture of shaft of fourth metacarpal bone, right hand, initial encounter for closed fracture: Secondary | ICD-10-CM | POA: Diagnosis not present

## 2021-03-19 DIAGNOSIS — S63054A Dislocation of other carpometacarpal joint of right hand, initial encounter: Secondary | ICD-10-CM | POA: Diagnosis not present

## 2021-05-02 ENCOUNTER — Other Ambulatory Visit: Payer: Self-pay | Admitting: Nurse Practitioner

## 2021-05-02 DIAGNOSIS — F32A Depression, unspecified: Secondary | ICD-10-CM

## 2021-05-02 DIAGNOSIS — F419 Anxiety disorder, unspecified: Secondary | ICD-10-CM

## 2021-05-02 NOTE — Telephone Encounter (Signed)
Requested medications are due for refill today yes  Requested medications are on the active medication list yes  Last refill 03/21/2020  Last visit 08/2019  Future visit scheduled no  Notes to clinic Failed protocol due to no valid visit within 6  months, no upcoming visit scheduled.

## 2021-05-15 ENCOUNTER — Emergency Department (HOSPITAL_BASED_OUTPATIENT_CLINIC_OR_DEPARTMENT_OTHER)
Admission: EM | Admit: 2021-05-15 | Discharge: 2021-05-15 | Disposition: A | Discharge status: Home / Self Care | Payer: Medicaid Other | Attending: Emergency Medicine | Admitting: Emergency Medicine

## 2021-05-15 ENCOUNTER — Encounter (HOSPITAL_BASED_OUTPATIENT_CLINIC_OR_DEPARTMENT_OTHER): Payer: Self-pay | Admitting: Urology

## 2021-05-15 ENCOUNTER — Emergency Department (HOSPITAL_BASED_OUTPATIENT_CLINIC_OR_DEPARTMENT_OTHER): Payer: Medicaid Other | Admitting: Radiology

## 2021-05-15 DIAGNOSIS — R0789 Other chest pain: Secondary | ICD-10-CM | POA: Insufficient documentation

## 2021-05-15 DIAGNOSIS — F17290 Nicotine dependence, other tobacco product, uncomplicated: Secondary | ICD-10-CM | POA: Insufficient documentation

## 2021-05-15 DIAGNOSIS — R079 Chest pain, unspecified: Secondary | ICD-10-CM

## 2021-05-15 LAB — CBC
HCT: 43.6 % (ref 39.0–52.0)
Hemoglobin: 15.2 g/dL (ref 13.0–17.0)
MCH: 32.7 pg (ref 26.0–34.0)
MCHC: 34.9 g/dL (ref 30.0–36.0)
MCV: 93.8 fL (ref 80.0–100.0)
Platelets: 210 10*3/uL (ref 150–400)
RBC: 4.65 MIL/uL (ref 4.22–5.81)
RDW: 11.6 % (ref 11.5–15.5)
WBC: 7.2 10*3/uL (ref 4.0–10.5)
nRBC: 0 % (ref 0.0–0.2)

## 2021-05-15 LAB — BASIC METABOLIC PANEL
Anion gap: 8 (ref 5–15)
BUN: 12 mg/dL (ref 6–20)
CO2: 30 mmol/L (ref 22–32)
Calcium: 9.7 mg/dL (ref 8.9–10.3)
Chloride: 103 mmol/L (ref 98–111)
Creatinine, Ser: 1.04 mg/dL (ref 0.61–1.24)
GFR, Estimated: >60 mL/min (ref >60)
Glucose, Bld: 85 mg/dL (ref 70–99)
Potassium: 3.9 mmol/L (ref 3.5–5.1)
Sodium: 141 mmol/L (ref 135–145)

## 2021-05-15 LAB — TROPONIN I (HIGH SENSITIVITY): Troponin I (High Sensitivity): <2 ng/L (ref <18)

## 2021-05-15 NOTE — Discharge Instructions (Addendum)
Recommend 800 mg ibuprofen every 8 hours as needed for pain.  Recommend 1000 mg of Tylenol every 6 hours as needed for pain. 

## 2021-05-15 NOTE — ED Provider Notes (Signed)
[facility] EMERGENCY DEPT Provider Note   CSN: [id number] Arrival date & time: 05/15/21  1838     History Chief Complaint  Patient presents with   Chest Pain    Cody Kelley is a 20 y.o. male.  The history is provided by the patient.  Chest Pain Chest pain location: chest wall. Pain quality: aching   Pain radiates to:  Does not radiate Pain severity:  Mild Onset quality:  Gradual Timing:  Constant Context: lifting and movement   Relieved by:  Nothing Worsened by:  Nothing Associated symptoms: no abdominal pain, no altered mental status, no back pain, no cough, no fever, no palpitations, no shortness of breath and no vomiting   Risk factors: no high cholesterol and no hypertension       Past Medical History:  Diagnosis Date   Anxiety    Broken arm    Depression    GERD (gastroesophageal reflux disease)    ocassional   Hand fracture, right    Headache    Mirgraine    Patient Active Problem List   Diagnosis Date Noted   MDD (major depressive disorder), recurrent severe, without psychosis (HCC) 11/06/2018    Past Surgical History:  Procedure Laterality Date   Arm Fracture Left    OPEN REDUCTION INTERNAL FIXATION (ORIF) METACARPAL Right 12/28/2020   Procedure: OPEN TREATMENT RIGHT 4TH METACARPAL FRACTURE AND PINNING 5TH CARPOMETACARPAL DISLOCATION;  Surgeon: Mack Hook, MD;  Location: [facility] OR;  Service: Orthopedics;  Laterality: Right;  PRE-OP BLOCK       Family History  Problem Relation Age of Onset   Schizophrenia Paternal Uncle    Diabetes Neg Hx    Hypertension Neg Hx     Social History   Tobacco Use   Smoking status: Never   Smokeless tobacco: Never  Vaping Use   Vaping Use: Every day   Substances: Nicotine  Substance Use Topics   Alcohol use: Yes    Comment: social   Drug use: Yes    Frequency: 7.0 times per week    Types: Marijuana    Comment: 9/14-22 smokes daily    Home Medications Prior to Admission medications    Medication Sig Start Date End Date Taking? Authorizing Provider  acetaminophen (TYLENOL) 325 MG tablet Take 2 tablets (650 mg total) by mouth every 6 (six) hours. 12/28/20   Mack Hook, MD  hydrOXYzine (ATARAX/VISTARIL) 25 MG tablet TAKE 1 TABLET(25 MG) BY MOUTH THREE TIMES DAILY AS NEEDED FOR ANXIETY Patient taking differently: Take 25 mg by mouth 3 (three) times daily as needed for anxiety. 01/06/20   Hoy Register, MD  ibuprofen (ADVIL) 200 MG tablet Take 3 tablets (600 mg total) by mouth every 6 (six) hours. 12/28/20   Mack Hook, MD  oxyCODONE (ROXICODONE) 5 MG immediate release tablet Take 1 tablet (5 mg total) by mouth every 6 (six) hours as needed for severe pain. 12/28/20   Mack Hook, MD  sertraline (ZOLOFT) 50 MG tablet Take 1 tablet (50 mg total) by mouth daily. 10/28/19   Claiborne Rigg, NP    Allergies    Patient has no known allergies.  Review of Systems   Review of Systems  Constitutional:  Negative for chills and fever.  HENT:  Negative for ear pain and sore throat.   Eyes:  Negative for pain and visual disturbance.  Respiratory:  Negative for cough and shortness of breath.   Cardiovascular:  Positive for chest pain. Negative for palpitations.  Gastrointestinal:  Negative for abdominal pain and vomiting.  Genitourinary:  Negative for dysuria and hematuria.  Musculoskeletal:  Negative for arthralgias and back pain.  Skin:  Negative for color change and rash.  Neurological:  Negative for seizures and syncope.  All other systems reviewed and are negative.  Physical Exam Updated Vital Signs BP 129/75 (BP Location: Right Arm)   Pulse (!) 40   Temp 98 F (36.7 C)   Resp 17   Ht 5\' 6"  (1.676 m)   Wt 61.2 kg   SpO2 100%   BMI 21.79 kg/m   Physical Exam Vitals and nursing note reviewed.  Constitutional:      Appearance: He is well-developed.  HENT:     Head: Normocephalic and atraumatic.  Eyes:     Extraocular Movements: Extraocular movements  intact.     Conjunctiva/sclera: Conjunctivae normal.     Pupils: Pupils are equal, round, and reactive to light.  Cardiovascular:     Rate and Rhythm: Normal rate and regular rhythm.     Pulses:          Radial pulses are 2+ on the right side and 2+ on the left side.     Heart sounds: No murmur heard. Pulmonary:     Effort: Pulmonary effort is normal. No respiratory distress.     Breath sounds: Normal breath sounds.  Chest:     Chest wall: Tenderness present.  Abdominal:     Palpations: Abdomen is soft.     Tenderness: There is no abdominal tenderness.  Musculoskeletal:     Cervical back: Normal range of motion and neck supple.  Skin:    General: Skin is warm and dry.     Capillary Refill: Capillary refill takes less than 2 seconds.  Neurological:     General: No focal deficit present.     Mental Status: He is alert.    ED Results / Procedures / Treatments   Labs (all labs ordered are listed, but only abnormal results are displayed) Labs Reviewed  BASIC METABOLIC PANEL  CBC  TROPONIN I (HIGH SENSITIVITY)    EKG EKG Interpretation  Date/Time:  Wednesday May 15 2021 19:05:30 EDT Ventricular Rate:  64 PR Interval:  108 QRS Duration: 84 QT Interval:  364 QTC Calculation: 375 R Axis:   88 Text Interpretation: Sinus rhythm with sinus arrhythmia with short PR Otherwise normal ECG Confirmed by [birth year] ([id number]) on 05/15/2021 7:08:09 PM  Radiology DG Chest 2 View  Result Date: 05/15/2021 CLINICAL DATA:  Chest pain. EXAM: CHEST - 2 VIEW COMPARISON:  None. FINDINGS: The heart size and mediastinal contours are within normal limits. Both lungs are clear. The visualized skeletal structures are unremarkable. IMPRESSION: No active cardiopulmonary disease. Electronically Signed   By: 05/17/2021 M.D.   On: 05/15/2021 19:13    Procedures Procedures   Medications Ordered in ED Medications - No data to display  ED Course  I have reviewed the triage vital signs and  the nursing notes.  Pertinent labs & imaging results that were available during my care of the patient were reviewed by me and considered in my medical decision making (see chart for details).    MDM Rules/Calculators/A&P                           Cody Kelley is here with chest pain.  Normal vitals.  No fever.  EKG shows sinus rhythm.  No ischemic changes.  Short PR  but there are no changes consistent with Wolff-Parkinson-White.  Just sinus arrhythmia.  Patient with normal troponin.  No cardiac risk factors.  No pneumonia, no pneumothorax.  No significant anemia electrolyte abnormality.  Lab work was collected prior to my evaluation.  He has reproducible chest pain on exam.  Overall suspect musculoskeletal process.  Discharged in good condition.  No concern for ACS or PE.  No cardiac risk factors.  No PE risk factors.  PERC negative.  No abdominal pain.  No concern for pancreatitis.  This chart was dictated using voice recognition software.  Despite best efforts to proofread,  errors can occur which can change the documentation meaning.   Final Clinical Impression(s) / ED Diagnoses Final diagnoses:  Chest pain, unspecified type    Rx / DC Orders ED Discharge Orders     None        Virgina Norfolk, DO 05/15/21 2124

## 2021-05-15 NOTE — ED Triage Notes (Signed)
Pt states mid sternal CP x 30 min, with SOB.  No radiation of pain. No cardiac h/o . Pain worse with movement, states pain intermittent

## 2021-05-16 ENCOUNTER — Telehealth: Payer: Self-pay

## 2021-05-16 NOTE — Telephone Encounter (Signed)
Transition Care Management Unsuccessful Follow-up Telephone Call  Date of discharge and from where:  05/15/2021-[facility]  Attempts:  1st Attempt  Reason for unsuccessful TCM follow-up call:  Left voice message

## 2021-05-18 NOTE — Telephone Encounter (Signed)
Transition Care Management Unsuccessful Follow-up Telephone Call  Date of discharge and from where:  05/15/2021 from [facility]  Attempts:  2nd Attempt  Reason for unsuccessful TCM follow-up call:  Left voice message

## 2021-05-19 NOTE — Telephone Encounter (Signed)
Transition Care Management Unsuccessful Follow-up Telephone Call  Date of discharge and from where:  05/15/2021 from [facility]  Attempts:  3rd Attempt  Reason for unsuccessful TCM follow-up call:  Unable to reach patient

## 2021-05-23 ENCOUNTER — Other Ambulatory Visit: Payer: Self-pay | Admitting: Family Medicine

## 2021-05-23 ENCOUNTER — Other Ambulatory Visit: Payer: Self-pay | Admitting: Nurse Practitioner

## 2021-05-23 DIAGNOSIS — F419 Anxiety disorder, unspecified: Secondary | ICD-10-CM

## 2021-05-23 DIAGNOSIS — F32A Depression, unspecified: Secondary | ICD-10-CM

## 2021-05-23 NOTE — Telephone Encounter (Signed)
Pt needs appt. Left VM to call back to schedule ?

## 2021-05-23 NOTE — Telephone Encounter (Signed)
Requested medication (s) are due for refill today: Yes  Requested medication (s) are on the active medication list: Yes  Last refill:  10/28/19  Future visit scheduled: No  Notes to clinic:  Pt. Needs appointment. Unable to reach.    Requested Prescriptions  Pending Prescriptions Disp Refills   sertraline (ZOLOFT) 50 MG tablet [Pharmacy Med Name: SERTRALINE 50MG  TABLETS] 90 tablet 2    Sig: TAKE 1 TABLET(50 MG) BY MOUTH DAILY     Psychiatry:  Antidepressants - SSRI Failed - 05/23/2021 11:11 AM      Failed - Completed PHQ-2 or PHQ-9 in the last 360 days      Failed - Valid encounter within last 6 months    Recent Outpatient Visits           1 year ago Encounter for annual physical exam   [facility] [place], [place], [place]   2 years ago Anxiety and depression   [facility] [place], [place], [place]

## 2021-05-23 NOTE — Telephone Encounter (Signed)
Pt. Has appointment 06/26/21.

## 2021-06-26 ENCOUNTER — Ambulatory Visit: Payer: Medicaid Other | Admitting: Nurse Practitioner

## 2021-06-27 ENCOUNTER — Other Ambulatory Visit: Payer: Self-pay | Admitting: Family Medicine

## 2021-06-27 ENCOUNTER — Telehealth: Payer: Self-pay | Admitting: Nurse Practitioner

## 2021-06-27 DIAGNOSIS — F419 Anxiety disorder, unspecified: Secondary | ICD-10-CM

## 2021-06-27 DIAGNOSIS — F32A Depression, unspecified: Secondary | ICD-10-CM

## 2021-06-28 ENCOUNTER — Other Ambulatory Visit: Payer: Self-pay

## 2021-06-28 NOTE — Telephone Encounter (Signed)
Patient's called in to get patient scheduled with appointment. Appointment scheduled for 07/02/21. Requesting to have medication sent in to hold him until appointment

## 2021-06-28 NOTE — Telephone Encounter (Signed)
Requested Prescriptions  Pending Prescriptions Disp Refills  . hydrOXYzine (ATARAX/VISTARIL) 25 MG tablet [Pharmacy Med Name: HYDROXYZINE HCL 25MG  TABS (WHITE)] 60 tablet 1    Sig: TAKE 1 TABLET(25 MG) BY MOUTH THREE TIMES DAILY AS NEEDED FOR ANXIETY     Ear, Nose, and Throat:  Antihistamines Failed - 06/27/2021  2:15 PM      Failed - Valid encounter within last 12 months    Recent Outpatient Visits          1 year ago Encounter for annual physical exam   [facility] [place], [place], NP   2 years ago Anxiety and depression   [facility] [place], [place], NP

## 2021-06-28 NOTE — Telephone Encounter (Signed)
Last seen 08/16/2019  Requested Prescriptions  Pending Prescriptions Disp Refills  . sertraline (ZOLOFT) 50 MG tablet [Pharmacy Med Name: SERTRALINE 50MG  TABLETS] 90 tablet 2    Sig: TAKE 1 TABLET(50 MG) BY MOUTH DAILY     Psychiatry:  Antidepressants - SSRI Failed - 06/27/2021  2:15 PM      Failed - Completed PHQ-2 or PHQ-9 in the last 360 days      Failed - Valid encounter within last 6 months    Recent Outpatient Visits          1 year ago Encounter for annual physical exam   [facility] [place], [place], NP   2 years ago Anxiety and depression   [facility] [place], [place], NP

## 2021-06-29 ENCOUNTER — Telehealth: Payer: Self-pay | Admitting: Critical Care Medicine

## 2021-06-29 DIAGNOSIS — F419 Anxiety disorder, unspecified: Secondary | ICD-10-CM

## 2021-06-29 MED ORDER — SERTRALINE HCL 50 MG PO TABS: 50.0000 mg | ORAL_TABLET | Freq: Every day | ORAL | 2 refills | Status: DC

## 2021-06-29 NOTE — Telephone Encounter (Signed)
Pt needs sertraline refilled   was expecting from Ms fleming but did not go through  I sent refill in for the pt  I was called by nurse on call for this request

## 2021-07-02 ENCOUNTER — Telehealth: Payer: Self-pay | Admitting: Nurse Practitioner

## 2021-07-02 ENCOUNTER — Telehealth: Payer: Medicaid Other | Admitting: Nurse Practitioner

## 2021-07-02 NOTE — Telephone Encounter (Signed)
NO answer LVM 

## 2021-07-09 ENCOUNTER — Other Ambulatory Visit: Payer: Self-pay

## 2021-07-09 ENCOUNTER — Ambulatory Visit: Payer: Medicaid Other | Attending: Nurse Practitioner | Admitting: Nurse Practitioner

## 2021-07-09 ENCOUNTER — Encounter: Payer: Self-pay | Admitting: Nurse Practitioner

## 2021-07-09 DIAGNOSIS — F419 Anxiety disorder, unspecified: Secondary | ICD-10-CM

## 2021-07-09 DIAGNOSIS — F32A Depression, unspecified: Secondary | ICD-10-CM

## 2021-07-09 MED ORDER — HYDROXYZINE HCL 25 MG PO TABS: ORAL_TABLET | ORAL | 6 refills | Status: DC

## 2021-07-09 NOTE — Progress Notes (Signed)
Virtual Visit via Telephone Note Due to national recommendations of social distancing due to COVID 19, telehealth visit is felt to be most appropriate for this patient at this time.  I discussed the limitations, risks, security and privacy concerns of performing an evaluation and management service by telephone and the availability of in person appointments. I also discussed with the patient that there may be a patient responsible charge related to this service. The patient expressed understanding and agreed to proceed.    I connected with Cody Kelley on 07/09/21  at   2:10 PM EST  EDT by telephone and verified that I am speaking with the correct person using two identifiers.  Location of Patient: Private Residence   [facility] of Provider: [facility]    Persons participating in Telemedicine visit: Bertram Denver FNP-BC Jacobb A Rosenbach    History of Present Illness: Telemedicine visit for: Anxiety and depression/medication refills  He has a history of insomnia with depression and anxiety. Associated symptoms include: depressed mood and difficulty concentrating. Onset of anxiety and depression was a few years ago. He does not endorse any current suicidal and homicidal plan or intent. Declines referral to behavioral health therapist today.    Previous treatment includes trazodone, hydroxyzine and sertraline with improvement in symptoms with hydroxyzine and setraline    Past Medical History:  Diagnosis Date   Anxiety    Broken arm    Depression    GERD (gastroesophageal reflux disease)    ocassional   Hand fracture, right    Headache    Mirgraine    Past Surgical History:  Procedure Laterality Date   Arm Fracture Left    OPEN REDUCTION INTERNAL FIXATION (ORIF) METACARPAL Right 12/28/2020   Procedure: OPEN TREATMENT RIGHT 4TH METACARPAL FRACTURE AND PINNING 5TH CARPOMETACARPAL DISLOCATION;  Surgeon: Mack Hook, MD;  Location: [facility] OR;  Service:  Orthopedics;  Laterality: Right;  PRE-OP BLOCK    Family History  Problem Relation Age of Onset   Schizophrenia Paternal Uncle    Diabetes Neg Hx    Hypertension Neg Hx     Social History   Socioeconomic History   Marital status: Single    Spouse name: Not on file   Number of children: Not on file   Years of education: Not on file   Highest education level: Not on file  Occupational History   Not on file  Tobacco Use   Smoking status: Never   Smokeless tobacco: Never  Vaping Use   Vaping Use: Every day   Substances: Nicotine  Substance and Sexual Activity   Alcohol use: Yes    Comment: social   Drug use: Yes    Frequency: 7.0 times per week    Types: Marijuana    Comment: 9/14-22 smokes daily   Sexual activity: Yes  Other Topics Concern   Not on file  Social History Narrative   Not on file   Social Determinants of Health   Financial Resource Strain: Not on file  Food Insecurity: Not on file  Transportation Needs: Not on file  Physical Activity: Not on file  Stress: Not on file  Social Connections: Not on file     Observations/Objective: Awake, alert and oriented x 3   Review of Systems  Constitutional:  Negative for fever, malaise/fatigue and weight loss.  HENT: Negative.  Negative for nosebleeds.   Eyes: Negative.  Negative for blurred vision, double vision and photophobia.  Respiratory: Negative.  Negative for cough and  shortness of breath.   Cardiovascular: Negative.  Negative for chest pain, palpitations and leg swelling.  Gastrointestinal: Negative.  Negative for heartburn, nausea and vomiting.  Musculoskeletal: Negative.  Negative for myalgias.  Neurological: Negative.  Negative for dizziness, focal weakness, seizures and headaches.  Psychiatric/Behavioral:  Positive for depression. Negative for suicidal ideas. The patient is nervous/anxious and has insomnia.    Assessment and Plan: Diagnoses and all orders for this visit:  Anxiety and  depression -     hydrOXYzine (ATARAX/VISTARIL) 25 MG tablet; TAKE 1 TABLET(25 MG) BY MOUTH THREE TIMES DAILY AS NEEDED FOR ANXIETY    Follow Up Instructions Return if symptoms worsen or fail to improve.     I discussed the assessment and treatment plan with the patient. The patient was provided an opportunity to ask questions and all were answered. The patient agreed with the plan and demonstrated an understanding of the instructions.   The patient was advised to call back or seek an in-person evaluation if the symptoms worsen or if the condition fails to improve as anticipated.  I provided 6 minutes of non-face-to-face time during this encounter including median intraservice time, reviewing previous notes, labs, imaging, medications and explaining diagnosis and management.  Claiborne Rigg, FNP-BC

## 2021-08-06 ENCOUNTER — Telehealth (HOSPITAL_BASED_OUTPATIENT_CLINIC_OR_DEPARTMENT_OTHER): Payer: Medicaid Other | Admitting: Nurse Practitioner

## 2021-08-06 ENCOUNTER — Ambulatory Visit: Payer: Self-pay

## 2021-08-06 DIAGNOSIS — F419 Anxiety disorder, unspecified: Secondary | ICD-10-CM

## 2021-08-06 DIAGNOSIS — F32A Depression, unspecified: Secondary | ICD-10-CM | POA: Diagnosis not present

## 2021-08-06 NOTE — Progress Notes (Signed)
Virtual Visit via Telephone Note Due to national recommendations of social distancing due to COVID 19, telehealth visit is felt to be most appropriate for this patient at this time.  I discussed the limitations, risks, security and privacy concerns of performing an evaluation and management service by telephone and the availability of in person appointments. I also discussed with the patient that there may be a patient responsible charge related to this service. The patient expressed understanding and agreed to proceed.    I connected with Cody Kelley on 08/08/21  at   3:10 PM EST  EDT by telephone and verified that I am speaking with the correct person using two identifiers.  Location of Patient: Private Residence   [facility] of Provider: [facility]    Persons participating in Telemedicine visit: Cody Denver FNP-BC Cody Kelley    History of Present Illness: Telemedicine visit for: Anxiety and Depression  Cody Kelley states he would like to stop taking hydroxyzine and zoloft. Feels they are both causing more anxiety. I offered to start an alternate SSRI and anxiolytic however he declines both today. States he would just like to stop both and not restart any medications. Also declined a therapy referral as well.  Instructions were given on how to stop zoloft over the next 2 weeks.    Past Medical History:  Diagnosis Date   Anxiety    Broken arm    Depression    GERD (gastroesophageal reflux disease)    ocassional   Hand fracture, right    Headache    Mirgraine    Past Surgical History:  Procedure Laterality Date   Arm Fracture Left    OPEN REDUCTION INTERNAL FIXATION (ORIF) METACARPAL Right 12/28/2020   Procedure: OPEN TREATMENT RIGHT 4TH METACARPAL FRACTURE AND PINNING 5TH CARPOMETACARPAL DISLOCATION;  Surgeon: Cody Hook, MD;  Location: [facility] OR;  Service: Orthopedics;  Laterality: Right;  PRE-OP BLOCK    Family History  Problem  Relation Age of Onset   Schizophrenia Paternal Uncle    Diabetes Neg Hx    Hypertension Neg Hx     Social History   Socioeconomic History   Marital status: Single    Spouse name: Not on file   Number of children: Not on file   Years of education: Not on file   Highest education level: Not on file  Occupational History   Not on file  Tobacco Use   Smoking status: Never   Smokeless tobacco: Never  Vaping Use   Vaping Use: Every day   Substances: Nicotine  Substance and Sexual Activity   Alcohol use: Yes    Comment: social   Drug use: Yes    Frequency: 7.0 times per week    Types: Marijuana    Comment: 9/14-22 smokes daily   Sexual activity: Yes  Other Topics Concern   Not on file  Social History Narrative   Not on file   Social Determinants of Health   Financial Resource Strain: Not on file  Food Insecurity: Not on file  Transportation Needs: Not on file  Physical Activity: Not on file  Stress: Not on file  Social Connections: Not on file     Observations/Objective: Awake, alert and oriented x 3   Review of Systems  Constitutional:  Negative for fever, malaise/fatigue and weight loss.  HENT: Negative.  Negative for nosebleeds.   Eyes: Negative.  Negative for blurred vision, double vision and photophobia.  Respiratory: Negative.  Negative for cough  and shortness of breath.   Cardiovascular: Negative.  Negative for chest pain, palpitations and leg swelling.  Gastrointestinal: Negative.  Negative for heartburn, nausea and vomiting.  Musculoskeletal: Negative.  Negative for myalgias.  Neurological: Negative.  Negative for dizziness, focal weakness, seizures and headaches.  Psychiatric/Behavioral:  Positive for depression. Negative for suicidal ideas. The patient is nervous/anxious.    Assessment and Plan: Diagnoses and all orders for this visit:  Anxiety and depression  Stop hydroxyzine and zoloft as instructed.   Follow Up Instructions Return if symptoms  worsen or fail to improve.     I discussed the assessment and treatment plan with the patient. The patient was provided an opportunity to ask questions and all were answered. The patient agreed with the plan and demonstrated an understanding of the instructions.   The patient was advised to call back or seek an in-person evaluation if the symptoms worsen or if the condition fails to improve as anticipated.  I provided 10 minutes of non-face-to-face time during this encounter including median intraservice time, reviewing previous notes, labs, imaging, medications and explaining diagnosis and management.  Cody Rigg, FNP-BC

## 2021-08-06 NOTE — Telephone Encounter (Signed)
Reason for Disposition . [1] Started on anti-anxiety medication AND [2] no relief  Protocols used: Anxiety and Panic Attack-A-AH

## 2021-08-06 NOTE — Telephone Encounter (Signed)
Grandmother called, spoke with her about getting in touch with pt. She states she is at work currently and feels like pt is at home resting. She says that symptoms have been going on for 3 days and pt just feels shaky inside. Pt wanted her to call for an appt. She reports that pt is taking medications as prescribed. Scheduled virtual visit for today at 1510 with Dr. Meredeth Ide. She states she will call pt and let him know of appt and she should be off work before then. Advised her to have pt call back if symptoms get worse or can go to nearest ER. She verbalized understanding.    Pt called, left VM to call office back to speak with a nurse about symptoms. Will attempt grandmother.     Summary: not feeling well   Pt called to get an appt for not feeling well/ he takes meds for depression and anxiety/ he stated he was feeling shakey inside / please advise  Call came from his [place] mother Raynelle Fanning and she was not with him / please advise

## 2021-08-08 ENCOUNTER — Encounter: Payer: Self-pay | Admitting: Nurse Practitioner

## 2021-08-17 ENCOUNTER — Ambulatory Visit: Payer: Self-pay

## 2021-08-17 NOTE — Telephone Encounter (Signed)
Please see triage from earlier today

## 2021-08-17 NOTE — Telephone Encounter (Addendum)
°  Chief Complaint: left rib pain Symptoms: none  Frequency: intermittent/ started 3 days ago Pertinent Negatives: Patient denies radiating chest pain, breathing difficulty, pain with deep breath, denies recent cough or illness Disposition: [] ED /[x] Urgent Care (no appt availability in office) / [] Appointment(In office/virtual)/ []  [place] [facility]/ [] Home Care/ [] Refused Recommended Disposition  Additional Notes: office closed-        Reason for Disposition  [1] Chest pain(s) lasting a few seconds AND [2] persists > 3 days  Answer Assessment - Initial Assessment Questions 1. LOCATION: "Where does it hurt?"       Rib pain left side 2. RADIATION: "Does the pain go anywhere else?" (e.g., into neck, jaw, arms, back)     no 3. ONSET: "When did the chest pain begin?" (Minutes, hours or days)      3 days 4. PATTERN "Does the pain come and go, or has it been constant since it started?"  "Does it get worse with exertion?"    Comes and goes 5. DURATION: "How long does it last" (e.g., seconds, minutes, hours)     6. SEVERITY: "How bad is the pain?"  (e.g., Scale 1-10; mild, moderate, or severe)    - MILD (1-3): doesn't interfere with normal activities     - MODERATE (4-7): interferes with normal activities or awakens from sleep    - SEVERE (8-10): excruciating pain, unable to do any normal activities       mild 7. CARDIAC RISK FACTORS: "Do you have any history of heart problems or risk factors for heart disease?" (e.g., angina, prior heart attack; diabetes, high blood pressure, high cholesterol, smoker, or strong family history of heart disease)     no 8. PULMONARY RISK FACTORS: "Do you have any history of lung disease?"  (e.g., blood clots in lung, asthma, emphysema, birth control pills)     no 9. CAUSE: "What do you think is causing the chest pain?"     Unknown- does vapes 10. OTHER SYMPTOMS: "Do you have any other symptoms?" (e.g., dizziness, nausea, vomiting, sweating, fever,  difficulty breathing, cough)       no  Protocols used: Chest Pain-A-AH

## 2021-09-06 IMAGING — RF DG HAND COMPLETE 3+V*R*
1 series · 4 of 4 positions shown · non-contrast
Comparison: December 16, 2020.

CLINICAL DATA: Surgery

EXAM:
RIGHT HAND - COMPLETE 3+ VIEW; DG C-ARM 1-60 MIN

[Series 1: run · 4 of 4 slices shown]
[im 1/4]
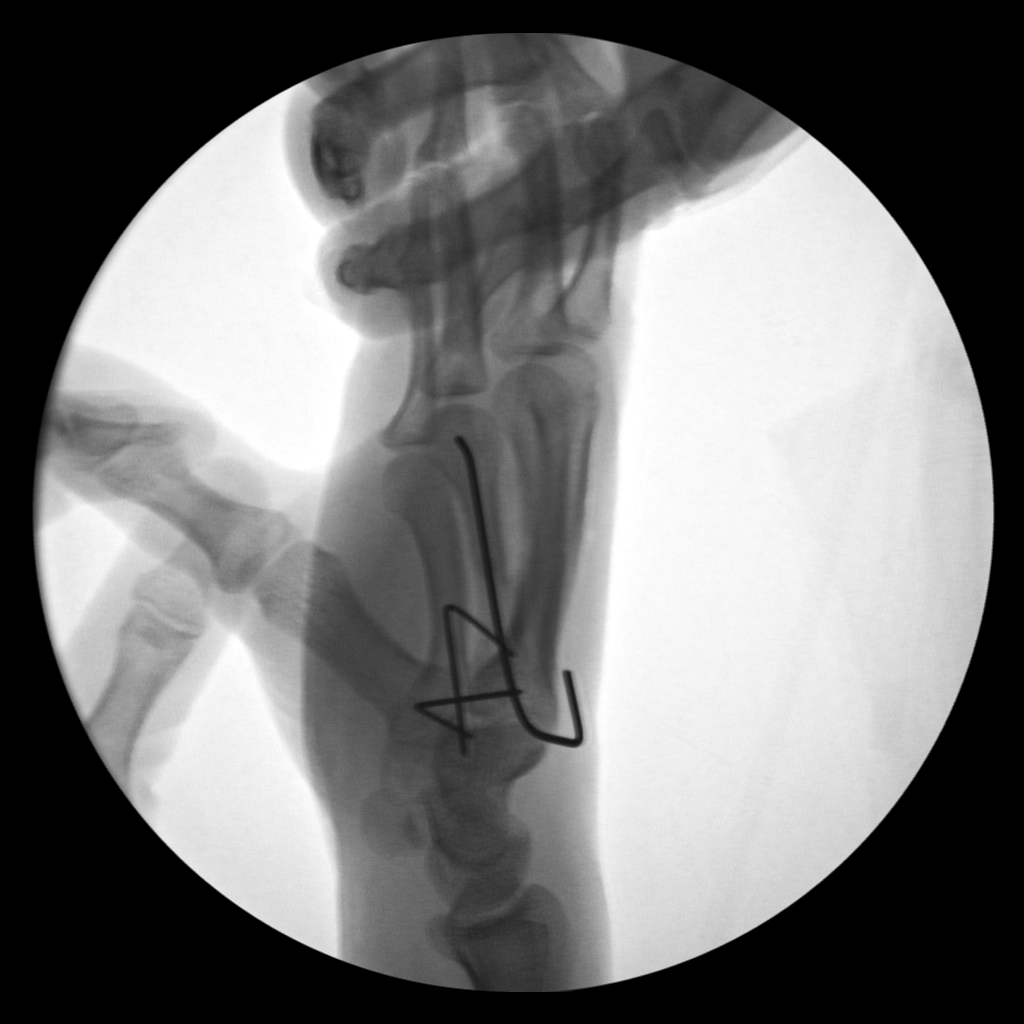
[im 2/4]
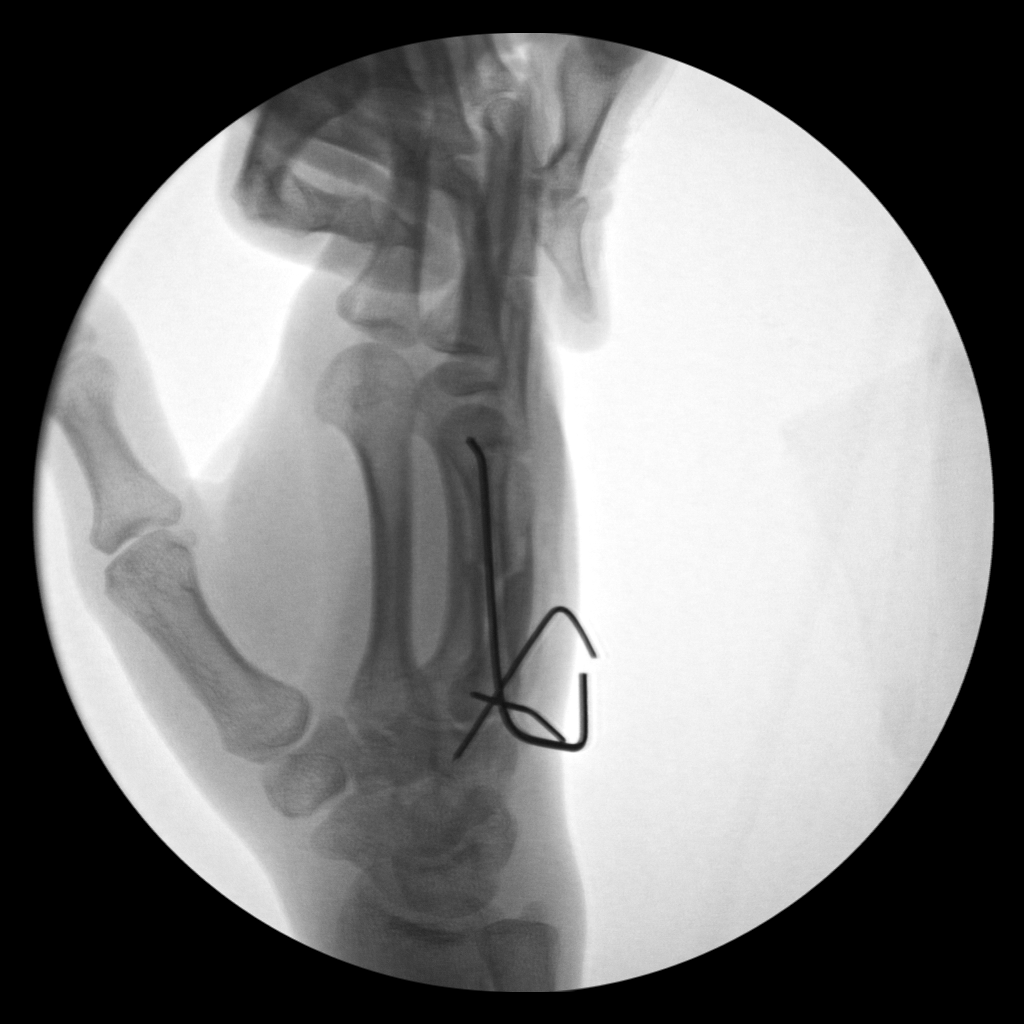
[im 3/4]
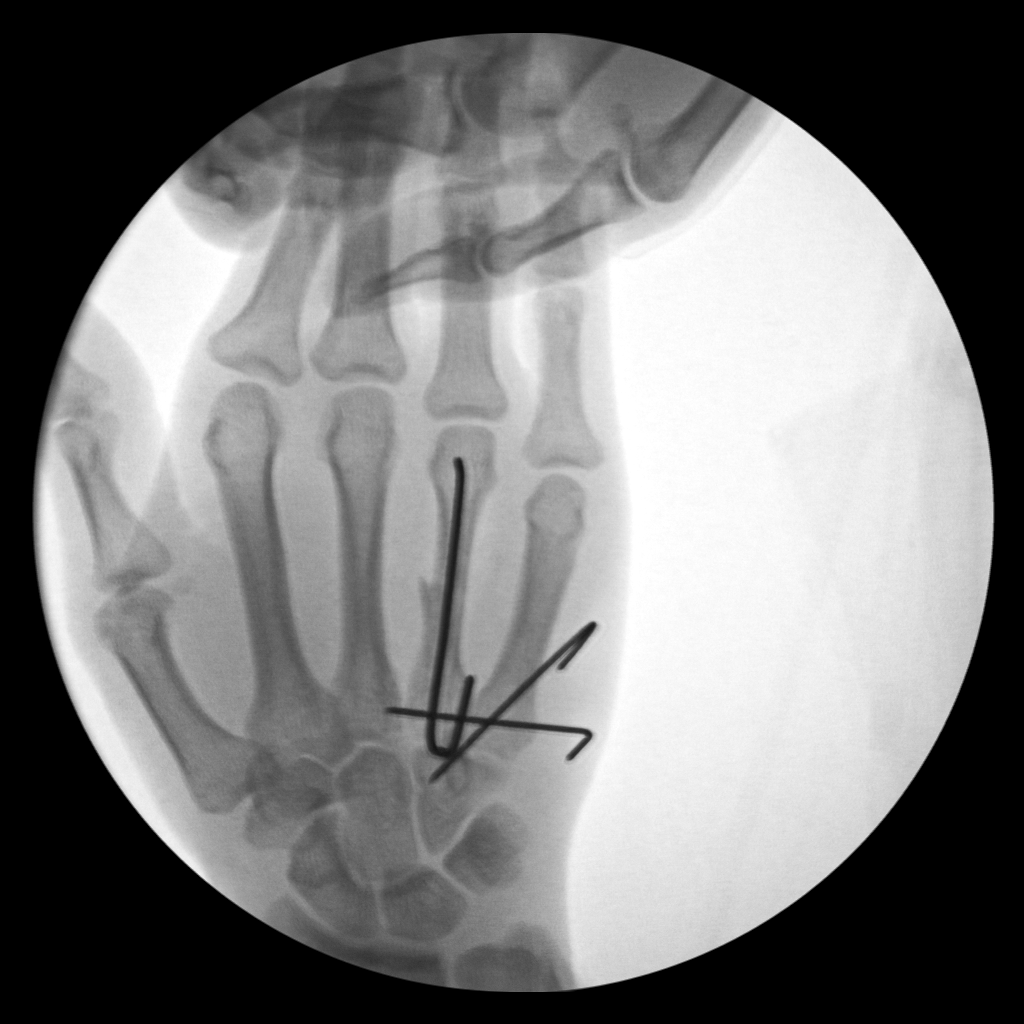
[im 4/4]
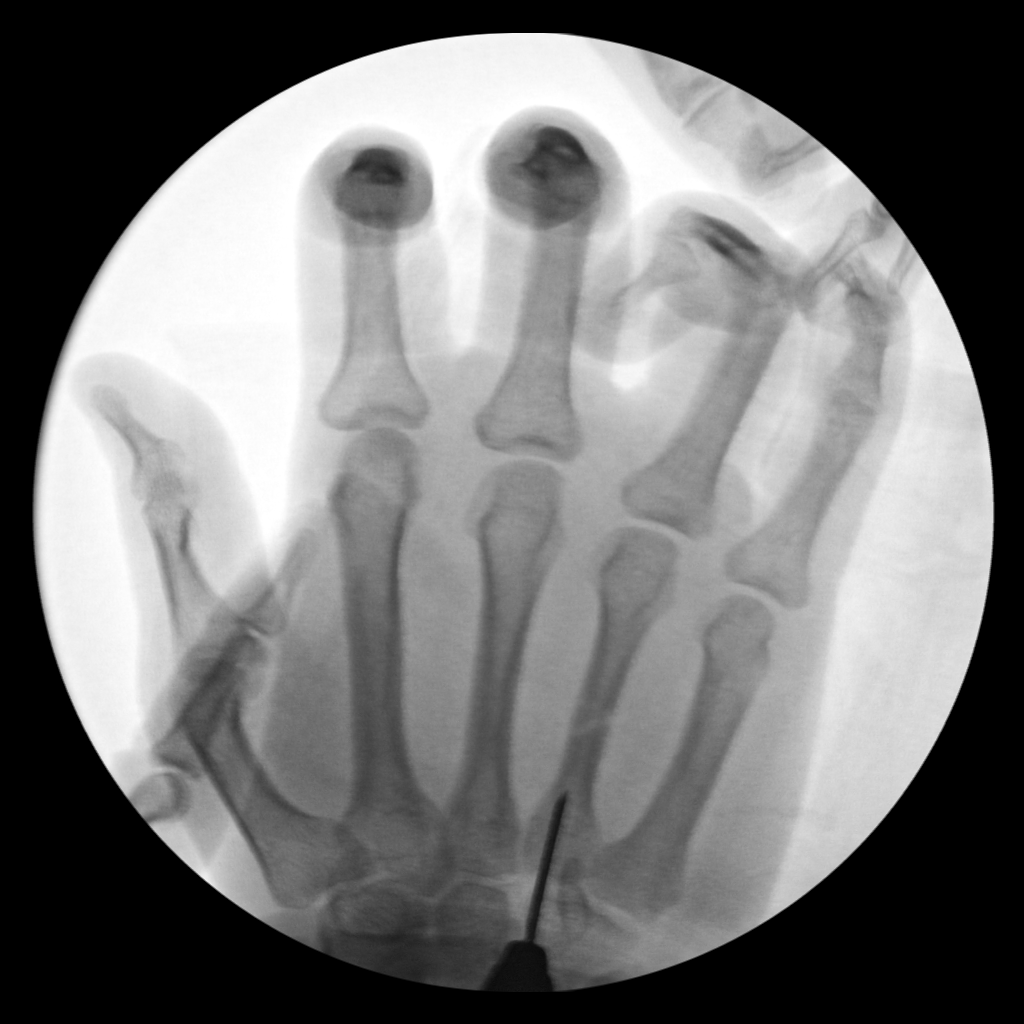

[4 of 4 positions shown; findings below may reference images not displayed]

FINDINGS: Fluoro time: 1 minutes and 40 seconds.

Four C-arm fluoroscopic images were obtained intraoperatively and
submitted for post operative interpretation. These images
demonstrate ORIF of a right fourth metacarpal fracture and pinning
of a fifth carpometacarpal dislocation. Alignment appears improved.
No unexpected findings. Please see the performing provider's
procedural report for further detail.
IMPRESSION: Intraoperative fluoroscopy, as detailed above.

## 2021-09-18 ENCOUNTER — Encounter (HOSPITAL_BASED_OUTPATIENT_CLINIC_OR_DEPARTMENT_OTHER): Payer: Self-pay

## 2021-09-18 ENCOUNTER — Emergency Department (HOSPITAL_BASED_OUTPATIENT_CLINIC_OR_DEPARTMENT_OTHER)
Admission: EM | Admit: 2021-09-18 | Discharge: 2021-09-18 | Disposition: A | Discharge status: Home / Self Care | Payer: Medicaid Other | Attending: Emergency Medicine | Admitting: Emergency Medicine

## 2021-09-18 ENCOUNTER — Emergency Department (HOSPITAL_BASED_OUTPATIENT_CLINIC_OR_DEPARTMENT_OTHER): Payer: Medicaid Other | Admitting: Radiology

## 2021-09-18 ENCOUNTER — Other Ambulatory Visit (HOSPITAL_BASED_OUTPATIENT_CLINIC_OR_DEPARTMENT_OTHER): Payer: Self-pay

## 2021-09-18 ENCOUNTER — Other Ambulatory Visit: Payer: Self-pay

## 2021-09-18 DIAGNOSIS — R0789 Other chest pain: Secondary | ICD-10-CM | POA: Diagnosis not present

## 2021-09-18 DIAGNOSIS — R001 Bradycardia, unspecified: Secondary | ICD-10-CM | POA: Diagnosis not present

## 2021-09-18 DIAGNOSIS — M79622 Pain in left upper arm: Secondary | ICD-10-CM | POA: Insufficient documentation

## 2021-09-18 MED ORDER — LIDOCAINE 5 % EX PTCH
1.0000 | MEDICATED_PATCH | Freq: Every day | CUTANEOUS | 0 refills | Status: AC | PRN
  Filled 2021-09-18: qty 3, 3d supply, fill #0

## 2021-09-18 MED ORDER — IBUPROFEN 600 MG PO TABS
600.0000 mg | ORAL_TABLET | Freq: Four times a day (QID) | ORAL | 0 refills | Status: AC | PRN
  Filled 2021-09-18: qty 30, 8d supply, fill #0

## 2021-09-18 MED ORDER — LIDOCAINE 5 % EX PTCH
1.0000 | MEDICATED_PATCH | CUTANEOUS | Status: DC
  Administered 2021-09-18: 1 via TRANSDERMAL
  Filled 2021-09-18: qty 1

## 2021-09-18 MED ORDER — METHOCARBAMOL 500 MG PO TABS
1000.0000 mg | ORAL_TABLET | Freq: Two times a day (BID) | ORAL | 0 refills | Status: AC
  Filled 2021-09-18: qty 20, 5d supply, fill #0

## 2021-09-18 MED ORDER — IBUPROFEN 800 MG PO TABS
800.0000 mg | ORAL_TABLET | Freq: Once | ORAL | Status: AC
  Administered 2021-09-18: 800 mg via ORAL
  Filled 2021-09-18: qty 1

## 2021-09-18 NOTE — ED Triage Notes (Signed)
Left upper chest pain for many months. States left ribs are tender, dull/uncomfortable feeling. Denies any cardiac sx.

## 2021-09-18 NOTE — ED Provider Notes (Signed)
[facility] EMERGENCY DEPT Provider Note   CSN: [id number] Arrival date & time: 09/18/21  0946     History  Chief Complaint  Patient presents with   Muscle Pain    Cody Kelley is a 21 y.o. male.  This is a 21 y.o. male with significant medical history as below, including anxiety/depression who presents to the ED with complaint of left-sided chest pain.  Pain has been ongoing for approximately 6 months localized to left axilla.  Described as tenderness, aching sensation.  No pain at rest but he has discomfort with direct palpation or torso twisting.  Pain is intermittent.  Was gradual onset ever since prior hand surgery approximately 6 months ago.  No trauma, no falls, no dyspnea, no nausea, no lightheadedness.  No recent travel sick contacts, no history of DVT or PE.  He was seen for similar symptoms in September, negative work-up.  He has not been taking medications for symptomatic control at home.  No recent travel or sick contacts.  Note patient does report that he self discontinued his Zoloft proximately 3 weeks ago because he felt that his symptoms have resolved, the medication had fixed him.  Does not follow with counselor or psychiatrist outpatient.  He has a PCP.     Past Medical History: No date: Anxiety No date: Broken arm No date: Depression No date: GERD (gastroesophageal reflux disease)     Comment:  ocassional No date: Hand fracture, right No date: Headache     Comment:  Mirgraine     The history is provided by the patient. No language interpreter was used.  Muscle Pain Associated symptoms include chest pain. Pertinent negatives include no abdominal pain, no headaches and no shortness of breath.      Home Medications Prior to Admission medications   Medication Sig Start Date End Date Taking? Authorizing Provider  ibuprofen (ADVIL) 600 MG tablet Take 1 tablet (600 mg total) by mouth every 6 (six) hours as needed. 09/18/21  Yes Wynona Dove A,  DO  lidocaine (LIDODERM) 5 % Place 1 patch onto the skin daily as needed for up to 15 doses. Remove & Discard patch within 12 hours or as directed by MD 09/18/21  Yes Jeanell Sparrow, DO  methocarbamol (ROBAXIN) 500 MG tablet Take 2 tablets (1,000 mg total) by mouth 2 (two) times daily for 5 days. 09/18/21 09/23/21 Yes Jeanell Sparrow, DO      Allergies    Patient has no known allergies.    Review of Systems   Review of Systems  Constitutional:  Negative for chills and fever.  HENT:  Negative for facial swelling and trouble swallowing.   Eyes:  Negative for photophobia and visual disturbance.  Respiratory:  Negative for cough and shortness of breath.   Cardiovascular:  Positive for chest pain. Negative for palpitations.  Gastrointestinal:  Negative for abdominal pain, nausea and vomiting.  Endocrine: Negative for polydipsia and polyuria.  Genitourinary:  Negative for difficulty urinating and hematuria.  Musculoskeletal:  Negative for gait problem and joint swelling.  Skin:  Negative for pallor and rash.  Neurological:  Negative for syncope and headaches.  Psychiatric/Behavioral:  Negative for agitation and confusion.    Physical Exam Updated Vital Signs BP 125/84    Pulse (!) 47    Temp 98.3 F (36.8 C) (Oral)    Resp 16    Ht 5\' 6"  (1.676 m)    Wt 61.2 kg    SpO2 98%  BMI 21.79 kg/m  Physical Exam Vitals and nursing note reviewed.  Constitutional:      General: He is not in acute distress.    Appearance: He is well-developed.  HENT:     Head: Normocephalic and atraumatic.     Right Ear: External ear normal.     Left Ear: External ear normal.     Mouth/Throat:     Mouth: Mucous membranes are moist.  Eyes:     General: No scleral icterus. Cardiovascular:     Rate and Rhythm: Regular rhythm. Bradycardia present.     Pulses: Normal pulses.     Heart sounds: Normal heart sounds.  Pulmonary:     Effort: Pulmonary effort is normal. No respiratory distress.     Breath sounds:  Normal breath sounds.  Abdominal:     General: Abdomen is flat.     Palpations: Abdomen is soft.     Tenderness: There is no abdominal tenderness.  Musculoskeletal:        General: Normal range of motion.       Arms:     Cervical back: Normal range of motion. No rigidity.     Right lower leg: No edema.     Left lower leg: No edema.     Comments: Mild tenderness to left axilla at approximate mammary line  Skin:    General: Skin is warm and dry.     Capillary Refill: Capillary refill takes less than 2 seconds.  Neurological:     Mental Status: He is alert and oriented to person, place, and time.     GCS: GCS eye subscore is 4. GCS verbal subscore is 5. GCS motor subscore is 6.  Psychiatric:        Mood and Affect: Mood normal.        Behavior: Behavior normal.    ED Results / Procedures / Treatments   Labs (all labs ordered are listed, but only abnormal results are displayed) Labs Reviewed - No data to display  EKG EKG Interpretation  Date/Time:  Wednesday September 18 2021 11:00:23 EST Ventricular Rate:  51 PR Interval:  121 QRS Duration: 82 QT Interval:  404 QTC Calculation: 372 R Axis:   79 Text Interpretation: Sinus rhythm Early repolarization similar to prior tracing Confirmed by Wynona Dove ([id number]) on 09/18/2021 11:35:06 AM  Radiology DG Chest 2 View  Result Date: 09/18/2021 CLINICAL DATA:  Provided history: Chest pain. Additional history provided: Patient reports left-sided chest and rib pain, no known injury. EXAM: CHEST - 2 VIEW COMPARISON:  Prior chest radiographs 05/15/2021 and earlier. FINDINGS: Heart size within normal limits. No appreciable airspace consolidation. No evidence of pleural effusion or pneumothorax. No acute bony abnormality identified. IMPRESSION: No evidence of active cardiopulmonary disease. Electronically Signed   By: Kellie Simmering D.O.   On: 09/18/2021 11:20    Procedures Procedures    Medications Ordered in ED Medications  lidocaine  (LIDODERM) 5 % 1 patch (1 patch Transdermal Patch Applied 09/18/21 1140)  ibuprofen (ADVIL) tablet 800 mg (800 mg Oral Given 09/18/21 1139)    ED Course/ Medical Decision Making/ A&P                           Medical Decision Making Amount and/or Complexity of Data Reviewed Radiology: ordered.  Risk Prescription drug management.    CC: Chest pain  This patient complains of chest pain; this involves an extensive number of treatment options and is  a complaint that carries with it a high risk of complications and morbidity. Vital signs were reviewed. Serious etiologies considered.  Record review:   Previous records obtained and reviewed     Work up as above, notable for:  imaging results that were available during my care of the patient were reviewed by me and considered in my medical decision making.   I ordered imaging studies which included chest x-ray and I independently visualized and interpreted imaging which showed no acute process  Management: Given lidocaine patch, Motrin   The patient's chest pain is not suggestive of pulmonary embolus, cardiac ischemia, aortic dissection, pericarditis, myocarditis, pulmonary embolism, pneumothorax, pneumonia, Zoster, or esophageal perforation, or other serious etiology.  Historically not abrupt in onset, tearing or ripping, pulses symmetric. EKG nonspecific for ischemia/infarction. No dysrhythmias, brugada, WPW, prolonged QT noted. CXR reviewed and WNL.  Low HEAR Score. Given the extremely low risk of these diagnoses further testing and evaluation for these possibilities does not appear to be indicated at this time. Patient in no distress and overall condition improved here in the ED. Detailed discussions were had with the patient regarding current findings, and need for close f/u with PCP or on call doctor. The patient has been instructed to return immediately if the symptoms worsen in any way for re-evaluation. Patient verbalized  understanding and is in agreement with current care plan. All questions answered prior to discharge.       This chart was dictated using voice recognition software.  Despite best efforts to proofread,  errors can occur which can change the documentation meaning.         Final Clinical Impression(s) / ED Diagnoses Final diagnoses:  Atypical chest pain    Rx / DC Orders ED Discharge Orders          Ordered    methocarbamol (ROBAXIN) 500 MG tablet  2 times daily        09/18/21 1141    lidocaine (LIDODERM) 5 %  Daily PRN        09/18/21 1141    ibuprofen (ADVIL) 600 MG tablet  Every 6 hours PRN        09/18/21 1141              Jeanell Sparrow, DO 09/18/21 1141

## 2021-09-18 NOTE — Discharge Instructions (Addendum)
It was a pleasure caring for you today in the emergency department.  Your chest x-ray was unremarkable.  Your EKG did not show evidence of a heart attack or an arrhythmia.  It was normal.   Please return to the emergency department for any worsening or worrisome symptoms.    Return to the Emergency Department if you have unusual chest pain, pressure, or discomfort, shortness of breath, nausea, vomiting, burping, heartburn, tingling upper body parts, sweating, cold, clammy skin, or racing heartbeat. Call 911 if you think you are having a heart attack. Take all cardiac medications as prescribed - notify your doctor if you have any side effects. Follow cardiac diet - avoid fatty & fried foods, don't eat too much red meat, eat lots of fruits & vegetables, and dairy products should be low fat. Please lose weight if you are overweight. Become more active with walking, gardening, or any other activity that gets you to moving.   Please return to the emergency department immediately for any new or concerning symptoms, or if you get worse.

## 2021-09-19 ENCOUNTER — Telehealth: Payer: Self-pay

## 2021-09-19 NOTE — Telephone Encounter (Signed)
Transition Care Management Unsuccessful Follow-up Telephone Call  Date of discharge and from where:  09/18/2021 from [facility]  Attempts:  1st Attempt  Reason for unsuccessful TCM follow-up call:  Left voice message

## 2021-09-20 ENCOUNTER — Telehealth: Payer: Self-pay | Admitting: Nurse Practitioner

## 2021-09-20 NOTE — Telephone Encounter (Signed)
Copied from [place] [id number]. Topic: General - Other >> Sep 20, 2021  2:22 PM Valere Dross wrote: Reason for CRM: Pts grandmother called in stating if PCP or PCP nurse could her a call back, about the pt's depression medication, please advise.

## 2021-09-20 NOTE — Telephone Encounter (Signed)
Transition Care Management Unsuccessful Follow-up Telephone Call  Date of discharge and from where:  09/18/2021 from [facility]  Attempts:  2nd Attempt  Reason for unsuccessful TCM follow-up call:  Left voice message

## 2021-09-20 NOTE — Telephone Encounter (Signed)
Left message on voicemail of patient to return call.   Advised grandma to take patient to UC or ED if sx's of "head don't feel right" worsen.   Informed of [facility] operations next week.   Do not see where patient is taking medications for depression. May need to call [facility] or [facility] for rx.

## 2021-09-23 ENCOUNTER — Other Ambulatory Visit (HOSPITAL_BASED_OUTPATIENT_CLINIC_OR_DEPARTMENT_OTHER): Payer: Self-pay

## 2021-09-23 NOTE — Telephone Encounter (Signed)
Transition Care Management Unsuccessful Follow-up Telephone Call  Date of discharge and from where:  09/18/2021[facility]  Attempts:  3rd Attempt  Reason for unsuccessful TCM follow-up call:  Left voice message

## 2021-09-24 ENCOUNTER — Other Ambulatory Visit (HOSPITAL_BASED_OUTPATIENT_CLINIC_OR_DEPARTMENT_OTHER): Payer: Self-pay

## 2021-10-09 NOTE — Progress Notes (Deleted)
Patient ID: Cody Kelley, male   DOB: 2001/05/22, 21 y.o.   MRN: [id number]   After being seen in the ED for CP.  Was given lidocaine patch, motrin and methocarbamol  From ED note: Cody Kelley is a 21 y.o. male.   This is a 21 y.o. male with significant medical history as below, including anxiety/depression who presents to the ED with complaint of left-sided chest pain.  Pain has been ongoing for approximately 6 months localized to left axilla.  Described as tenderness, aching sensation.  No pain at rest but he has discomfort with direct palpation or torso twisting.  Pain is intermittent.  Was gradual onset ever since prior hand surgery approximately 6 months ago.  No trauma, no falls, no dyspnea, no nausea, no lightheadedness.  No recent travel sick contacts, no history of DVT or PE.  He was seen for similar symptoms in September, negative work-up.  He has not been taking medications for symptomatic control at home.  No recent travel or sick contacts.   Note patient does report that he self discontinued his Zoloft proximately 3 weeks ago because he felt that his symptoms have resolved, the medication had fixed him.  Does not follow with counselor or psychiatrist outpatient.  He has a PCP.  From A/P: The patient's chest pain is not suggestive of pulmonary embolus, cardiac ischemia, aortic dissection, pericarditis, myocarditis, pulmonary embolism, pneumothorax, pneumonia, Zoster, or esophageal perforation, or other serious etiology.  Historically not abrupt in onset, tearing or ripping, pulses symmetric. EKG nonspecific for ischemia/infarction. No dysrhythmias, brugada, WPW, prolonged QT noted. CXR reviewed and WNL.  Low HEAR Score. Given the extremely low risk of these diagnoses further testing and evaluation for these possibilities does not appear to be indicated at this time. Patient in no distress and overall condition improved here in the ED. Detailed discussions were had with the patient  regarding current findings, and need for close f/u with PCP or on call doctor. The patient has been instructed to return immediately if the symptoms worsen in any way for re-evaluation. Patient verbalized understanding and is in agreement with current care plan. All questions answered prior to discharge

## 2021-10-10 ENCOUNTER — Inpatient Hospital Stay: Payer: Medicaid Other | Admitting: Physician Assistant

## 2021-10-31 ENCOUNTER — Inpatient Hospital Stay: Payer: Medicaid Other | Admitting: Physician Assistant

## 2021-10-31 NOTE — Progress Notes (Deleted)
Patient ID: Cody Kelley, male   DOB: [birth year], 21 y.o.   MRN: [id number] ? ?Seen at [facility] ED 09/18/2021 for 6 month h/o L sided CP.  Also of note, he had self-discontinued zoloft about 3 weeks prior to the visit.  ? ? ?From ED A/P: ?Management: ?Given lidocaine patch, Motrin ?  ?  ?The patient's chest pain is not suggestive of pulmonary embolus, cardiac ischemia, aortic dissection, pericarditis, myocarditis, pulmonary embolism, pneumothorax, pneumonia, Zoster, or esophageal perforation, or other serious etiology.  Historically not abrupt in onset, tearing or ripping, pulses symmetric. EKG nonspecific for ischemia/infarction. No dysrhythmias, brugada, WPW, prolonged QT noted. CXR reviewed and WNL.  Low HEAR Score. Given the extremely low risk of these diagnoses further testing and evaluation for these possibilities does not appear to be indicated at this time. Patient in no distress and overall condition improved here in the ED. Detailed discussions were had with the patient regarding current findings, and need for close f/u with PCP or on call doctor. The patient has been instructed to return immediately if the symptoms worsen in any way for re-evaluation. Patient verbalized understanding and is in agreement with current care plan. All questions answered prior to discharge.  ?

## 2021-11-08 ENCOUNTER — Telehealth: Payer: Self-pay | Admitting: Nurse Practitioner

## 2021-11-08 NOTE — Telephone Encounter (Signed)
Pts grandmother is calling to schedule hospital follow up with Dr. Margarita Rana. Please advise [phone] ?

## 2021-11-14 ENCOUNTER — Telehealth: Payer: Self-pay

## 2021-11-14 NOTE — Telephone Encounter (Signed)
Called g-maw was not with pt so they will call back tomorrow to schedule an appointment.  ?

## 2021-11-14 NOTE — Telephone Encounter (Signed)
Patient grandmother [name] called in stated that she have called several times and have been waiting for a call back from [facility] nurse but no one have called her to get him an appointment before the next available date in April. Asking for a call back at Ph#  [phone] ?

## 2021-11-14 NOTE — Telephone Encounter (Signed)
Patient grandmother Cody Kelley called in stated  that she have bbeen waiting on a [phone] ?

## 2022-05-28 IMAGING — DX DG CHEST 2V
2 series · 2 of 2 positions shown · non-contrast
Comparison: Prior chest radiographs 05/15/2021 and earlier.

CLINICAL DATA: Provided history: Chest pain. Additional history
provided: Patient reports left-sided chest and rib pain, no known
injury.

EXAM:
CHEST - 2 VIEW

[chest pa]
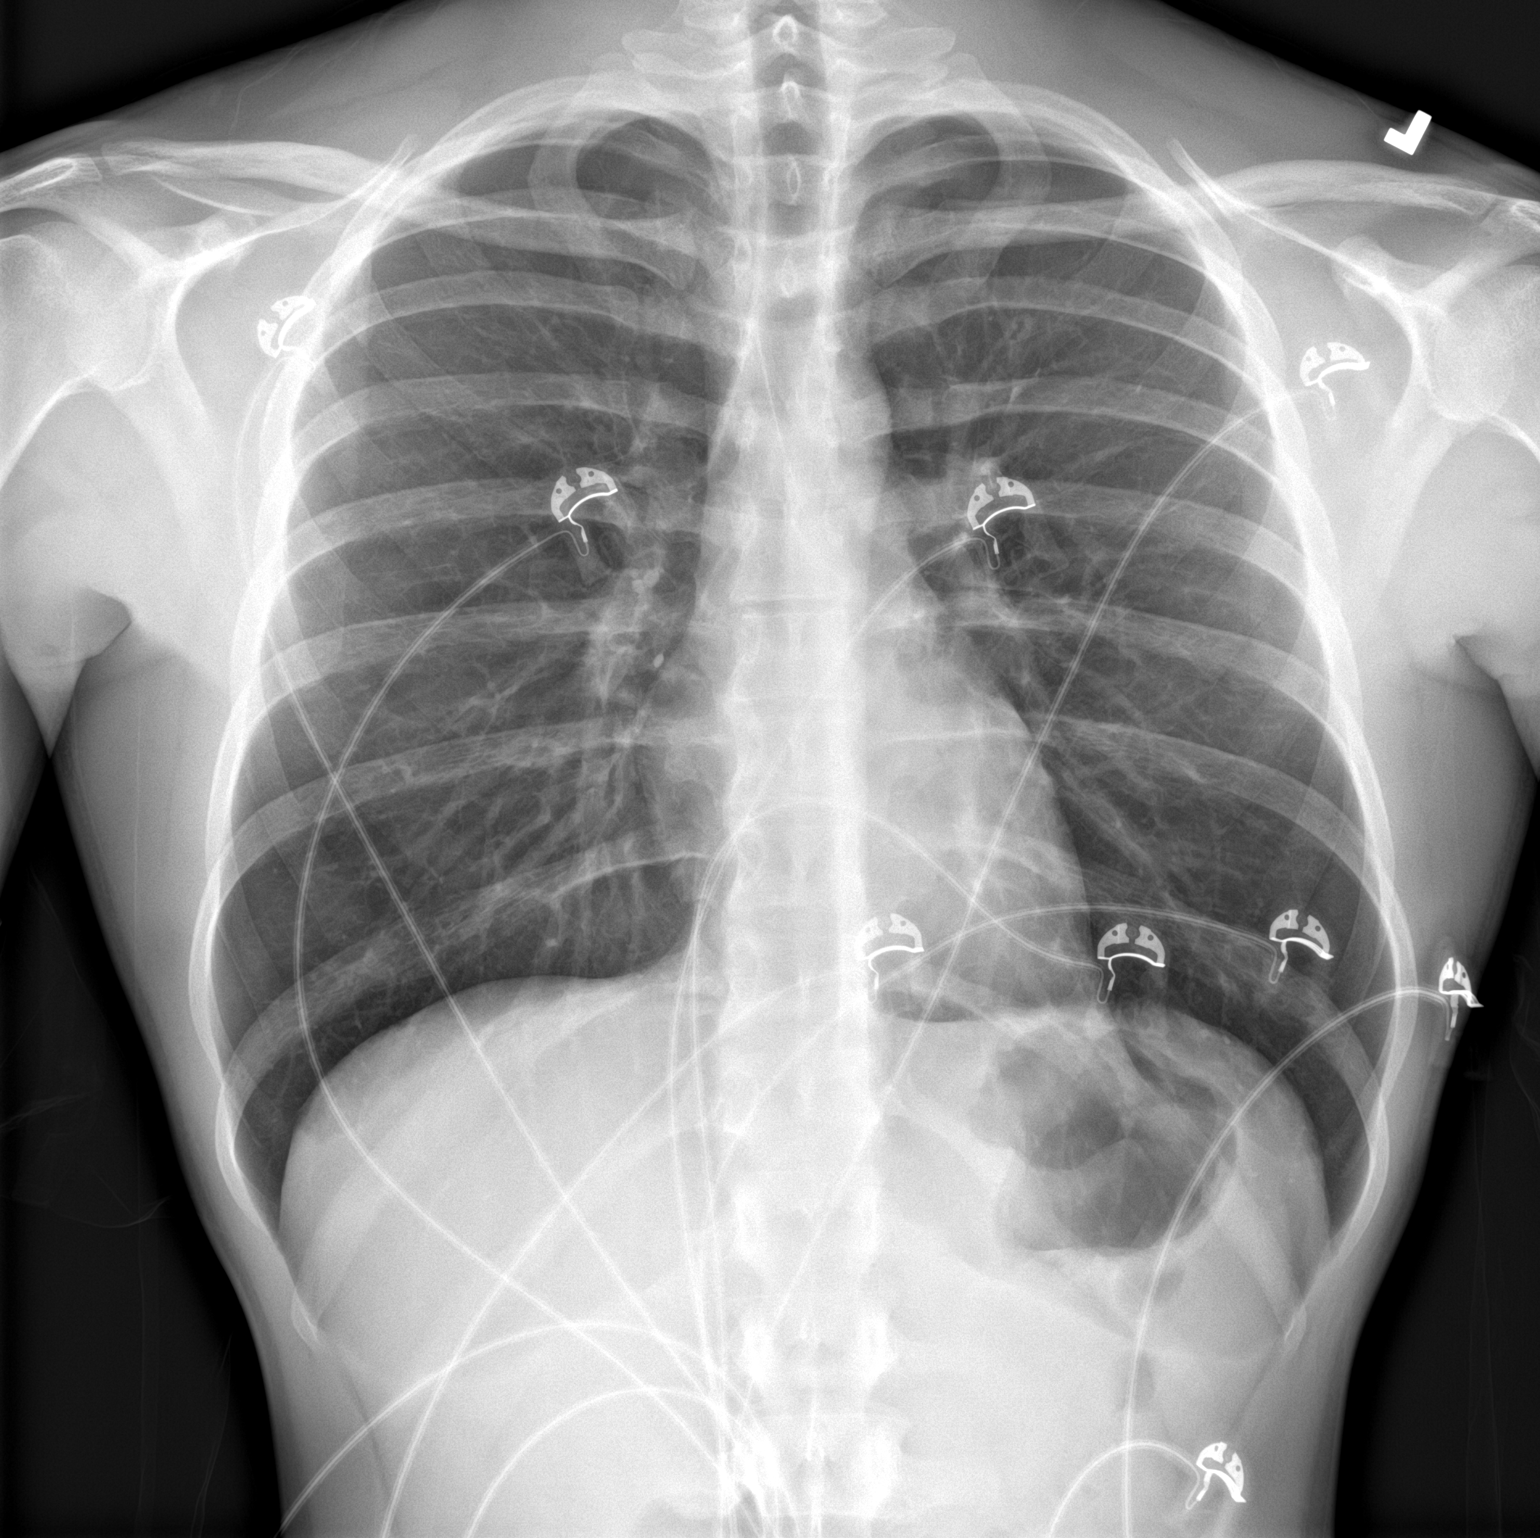

[chest lat]
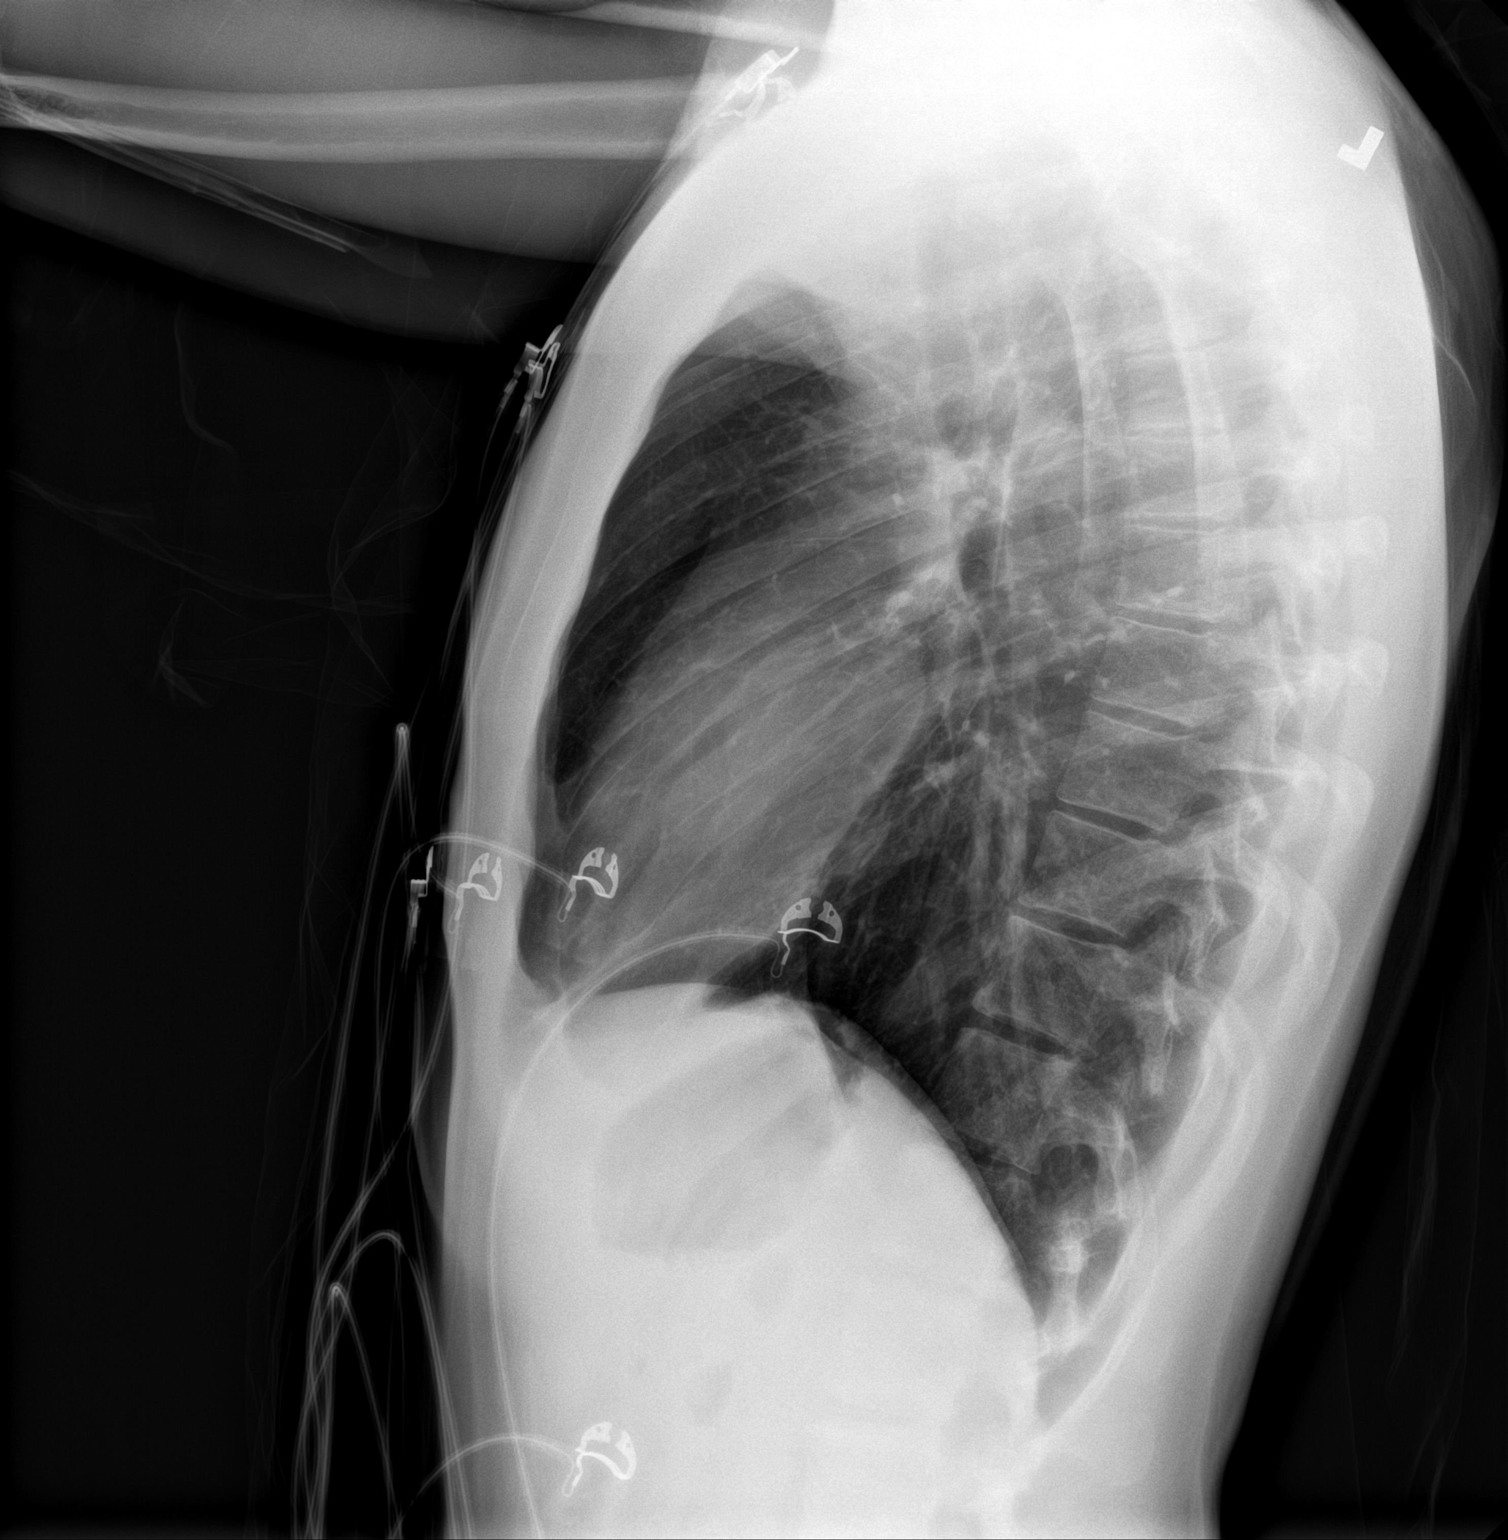

[2 of 2 positions shown; findings below may reference images not displayed]

FINDINGS: Heart size within normal limits. No appreciable airspace
consolidation. No evidence of pleural effusion or pneumothorax. No
acute bony abnormality identified.
IMPRESSION: No evidence of active cardiopulmonary disease.
